# Patient Record
Sex: Female | Born: 1975 | Race: Black or African American | Hispanic: No | Marital: Single | Smoking: Never smoker
Health system: Southern US, Community
[De-identification: ages and names within clinical notes are randomized; demographics above are authoritative.]

## PROBLEM LIST (undated history)

## (undated) DIAGNOSIS — I10 Essential (primary) hypertension: Secondary | ICD-10-CM

## (undated) DIAGNOSIS — G40909 Epilepsy, unspecified, not intractable, without status epilepticus: Secondary | ICD-10-CM

## (undated) DIAGNOSIS — E785 Hyperlipidemia, unspecified: Secondary | ICD-10-CM

## (undated) HISTORY — DX: Epilepsy, unspecified, not intractable, without status epilepticus: G40.909

## (undated) HISTORY — DX: Essential (primary) hypertension: I10

## (undated) HISTORY — PX: OTHER SURGICAL HISTORY: SHX169

## (undated) HISTORY — DX: Hyperlipidemia, unspecified: E78.5

---

## 2010-12-22 HISTORY — PX: WISDOM TOOTH EXTRACTION: SHX21

## 2018-12-08 ENCOUNTER — Emergency Department: Payer: BC Managed Care – PPO

## 2018-12-08 ENCOUNTER — Observation Stay
Admission: EM | Admit: 2018-12-08 | Discharge: 2018-12-09 | Disposition: A | Discharge status: Home / Self Care | Payer: BC Managed Care – PPO | Attending: Internal Medicine | Admitting: Internal Medicine

## 2018-12-08 ENCOUNTER — Encounter: Payer: Self-pay | Admitting: Radiology

## 2018-12-08 ENCOUNTER — Other Ambulatory Visit: Payer: Self-pay

## 2018-12-08 DIAGNOSIS — G4089 Other seizures: Principal | ICD-10-CM | POA: Insufficient documentation

## 2018-12-08 DIAGNOSIS — Z79899 Other long term (current) drug therapy: Secondary | ICD-10-CM | POA: Insufficient documentation

## 2018-12-08 DIAGNOSIS — E860 Dehydration: Secondary | ICD-10-CM | POA: Diagnosis not present

## 2018-12-08 DIAGNOSIS — R569 Unspecified convulsions: Secondary | ICD-10-CM

## 2018-12-08 DIAGNOSIS — E876 Hypokalemia: Secondary | ICD-10-CM | POA: Diagnosis not present

## 2018-12-08 LAB — URINALYSIS, COMPLETE (UACMP) WITH MICROSCOPIC
Bacteria, UA: NONE SEEN
Bilirubin Urine: NEGATIVE
Glucose, UA: >=500 mg/dL — AB
KETONES UR: NEGATIVE mg/dL
Leukocytes, UA: NEGATIVE
Nitrite: NEGATIVE
PH: 5 (ref 5.0–8.0)
Protein, ur: 30 mg/dL — AB
Specific Gravity, Urine: 1.012 (ref 1.005–1.030)

## 2018-12-08 LAB — URINE DRUG SCREEN, QUALITATIVE (ARMC ONLY)
Amphetamines, Ur Screen: NOT DETECTED
Barbiturates, Ur Screen: NOT DETECTED
Benzodiazepine, Ur Scrn: NOT DETECTED
Cannabinoid 50 Ng, Ur ~~LOC~~: POSITIVE — AB
Cocaine Metabolite,Ur ~~LOC~~: NOT DETECTED
MDMA (Ecstasy)Ur Screen: NOT DETECTED
Methadone Scn, Ur: NOT DETECTED
Opiate, Ur Screen: NOT DETECTED
Phencyclidine (PCP) Ur S: NOT DETECTED
Tricyclic, Ur Screen: NOT DETECTED

## 2018-12-08 LAB — COMPREHENSIVE METABOLIC PANEL
ALT: 14 U/L (ref 0–44)
AST: 31 U/L (ref 15–41)
Albumin: 4.1 g/dL (ref 3.5–5.0)
Alkaline Phosphatase: 53 U/L (ref 38–126)
Anion gap: 15 (ref 5–15)
BUN: 8 mg/dL (ref 6–20)
CO2: 17 mmol/L — ABNORMAL LOW (ref 22–32)
Calcium: 8.9 mg/dL (ref 8.9–10.3)
Chloride: 105 mmol/L (ref 98–111)
Creatinine, Ser: 1 mg/dL (ref 0.44–1.00)
GFR calc Af Amer: >60 mL/min (ref >60)
GFR calc non Af Amer: >60 mL/min (ref >60)
Glucose, Bld: 154 mg/dL — ABNORMAL HIGH (ref 70–99)
Potassium: 3.2 mmol/L — ABNORMAL LOW (ref 3.5–5.1)
Sodium: 137 mmol/L (ref 135–145)
Total Bilirubin: 0.6 mg/dL (ref 0.3–1.2)
Total Protein: 8.1 g/dL (ref 6.5–8.1)

## 2018-12-08 LAB — CBC WITH DIFFERENTIAL/PLATELET
Abs Immature Granulocytes: 0.03 10*3/uL (ref 0.00–0.07)
Basophils Absolute: 0.1 10*3/uL (ref 0.0–0.1)
Basophils Relative: 1 %
Eosinophils Absolute: 0.1 10*3/uL (ref 0.0–0.5)
Eosinophils Relative: 1 %
HCT: 42.4 % (ref 36.0–46.0)
Hemoglobin: 13.8 g/dL (ref 12.0–15.0)
Immature Granulocytes: 0 %
LYMPHS PCT: 50 %
Lymphs Abs: 4.9 10*3/uL — ABNORMAL HIGH (ref 0.7–4.0)
MCH: 30.2 pg (ref 26.0–34.0)
MCHC: 32.5 g/dL (ref 30.0–36.0)
MCV: 92.8 fL (ref 80.0–100.0)
MONO ABS: 0.6 10*3/uL (ref 0.1–1.0)
Monocytes Relative: 6 %
Neutro Abs: 4 10*3/uL (ref 1.7–7.7)
Neutrophils Relative %: 42 %
Platelets: 537 10*3/uL — ABNORMAL HIGH (ref 150–400)
RBC: 4.57 MIL/uL (ref 3.87–5.11)
RDW: 12.6 % (ref 11.5–15.5)
WBC: 9.7 10*3/uL (ref 4.0–10.5)
nRBC: 0 % (ref 0.0–0.2)

## 2018-12-08 LAB — ETHANOL: Alcohol, Ethyl (B): <10 mg/dL (ref <10)

## 2018-12-08 LAB — TROPONIN I: Troponin I: <0.03 ng/mL (ref <0.03)

## 2018-12-08 LAB — SALICYLATE LEVEL: Salicylate Lvl: <7 mg/dL (ref 2.8–30.0)

## 2018-12-08 LAB — ACETAMINOPHEN LEVEL: Acetaminophen (Tylenol), Serum: <10 ug/mL — ABNORMAL LOW (ref 10–30)

## 2018-12-08 MED ORDER — LORAZEPAM 2 MG/ML IJ SOLN: 2.0000 mg | INTRAMUSCULAR | Status: DC | PRN

## 2018-12-08 MED ORDER — ENOXAPARIN SODIUM 40 MG/0.4ML ~~LOC~~ SOLN
40.0000 mg | SUBCUTANEOUS | Status: DC
  Administered 2018-12-08: 20:00:00 40 mg via SUBCUTANEOUS
  Filled 2018-12-08: qty 0.4

## 2018-12-08 MED ORDER — ONDANSETRON HCL 4 MG/2ML IJ SOLN
4.0000 mg | Freq: Four times a day (QID) | INTRAMUSCULAR | Status: DC | PRN
  Administered 2018-12-08: 4 mg via INTRAVENOUS
  Filled 2018-12-08: qty 2

## 2018-12-08 MED ORDER — LORAZEPAM 2 MG/ML IJ SOLN
INTRAMUSCULAR | Status: AC
  Administered 2018-12-08: 2 mg via INTRAVENOUS
  Filled 2018-12-08: qty 1

## 2018-12-08 MED ORDER — POTASSIUM CHLORIDE CRYS ER 20 MEQ PO TBCR
40.0000 meq | EXTENDED_RELEASE_TABLET | Freq: Once | ORAL | Status: AC
  Administered 2018-12-08: 40 meq via ORAL
  Filled 2018-12-08: qty 2

## 2018-12-08 MED ORDER — ONDANSETRON HCL 4 MG PO TABS: 4.0000 mg | ORAL_TABLET | Freq: Four times a day (QID) | ORAL | Status: DC | PRN

## 2018-12-08 MED ORDER — LORAZEPAM 2 MG/ML IJ SOLN
2.0000 mg | Freq: Once | INTRAMUSCULAR | Status: AC
  Administered 2018-12-08: 2 mg via INTRAVENOUS

## 2018-12-08 MED ORDER — SODIUM CHLORIDE 0.9 % IV SOLN
INTRAVENOUS | Status: DC
  Administered 2018-12-08 – 2018-12-09 (×2): via INTRAVENOUS

## 2018-12-08 MED ORDER — ACETAMINOPHEN 650 MG RE SUPP: 650.0000 mg | Freq: Four times a day (QID) | RECTAL | Status: DC | PRN

## 2018-12-08 MED ORDER — SENNOSIDES-DOCUSATE SODIUM 8.6-50 MG PO TABS: 1.0000 | ORAL_TABLET | Freq: Every evening | ORAL | Status: DC | PRN

## 2018-12-08 MED ORDER — LEVETIRACETAM IN NACL 1000 MG/100ML IV SOLN
1000.0000 mg | Freq: Once | INTRAVENOUS | Status: AC
  Administered 2018-12-08: 1000 mg via INTRAVENOUS
  Filled 2018-12-08: qty 100

## 2018-12-08 MED ORDER — ACETAMINOPHEN 325 MG PO TABS
650.0000 mg | ORAL_TABLET | Freq: Four times a day (QID) | ORAL | Status: DC | PRN
  Administered 2018-12-08 – 2018-12-09 (×2): 650 mg via ORAL
  Filled 2018-12-08 (×2): qty 2

## 2018-12-08 NOTE — ED Notes (Signed)
Patient transported to CT 

## 2018-12-08 NOTE — ED Provider Notes (Signed)
[facility] Emergency Department Provider Note ____________________________________________   First MD Initiated Contact with Patient 12/08/18 1309     (approximate)  I have reviewed the triage vital signs and the nursing notes.   HISTORY  Chief Complaint Altered Mental Status  Level 5 caveat: History of present illness limited due to altered mental status  HPI Anita Miller is a 42 y.o. female with no known PMH who presents with altered mental status.  Per EMS, the patient was at work and her coworkers heard a thump and found her on the ground unresponsive.  No seizure activity was noted at that time.  On EMS arrival the patient was alert but not responsive and then became somewhat agitated.  The patient is unable to give any significant history.  She is alert and answering some of my questions but does not remember what happened and does not know where she is.  She denies pain.  No past medical history on file.  There are no active problems to display for this patient.     Prior to Admission medications   Not on File    Allergies Patient has no allergy information on record.  No family history on file.  Social History Social History   Tobacco Use  . Smoking status: Not on file  Substance Use Topics  . Alcohol use: Not on file  . Drug use: Not on file    Review of Systems Level 5 caveat: Unable to obtain review of systems due to altered mental status   ____________________________________________   PHYSICAL EXAM:  VITAL SIGNS: ED Triage Vitals  Enc Vitals Group     BP 12/08/18 1256 (!) 143/95     Pulse Rate 12/08/18 1256 72     Resp 12/08/18 1256 18     Temp 12/08/18 1256 97.7 F (36.5 C)     Temp src --      SpO2 12/08/18 1256 94 %     Weight 12/08/18 1257 120 lb (54.4 kg)     Height 12/08/18 1257 5' 4"  (1.626 m)     Head Circumference --      Peak Flow --      Pain Score 12/08/18 1257 0     Pain Loc --      Pain  Edu? --      Excl. in [place]? --     Constitutional: Alert, disoriented.  Comfortable appearing. Eyes: Conjunctivae are normal.  EOMI.  PERRLA. Head: Atraumatic. Nose: No congestion/rhinnorhea. Mouth/Throat: Mucous membranes are moist.   Neck: Normal range of motion.  Cardiovascular: Normal rate, regular rhythm. Grossly normal heart sounds.  Good peripheral circulation. Respiratory: Normal respiratory effort.  No retractions. Lungs CTAB. Gastrointestinal: Soft and nontender. No distention.  Genitourinary: No flank tenderness. Musculoskeletal:  Extremities warm and well perfused.  Neurologic:  Normal speech.  Motor intact in all extremities. Skin:  Skin is warm and dry. No rash noted. Psychiatric: Unable to assess due to altered mental status.  ____________________________________________   LABS (all labs ordered are listed, but only abnormal results are displayed)  Labs Reviewed  COMPREHENSIVE METABOLIC PANEL - Abnormal; Notable for the following components:      Result Value   Potassium 3.2 (*)    CO2 17 (*)    Glucose, Bld 154 (*)    All other components within normal limits  CBC WITH DIFFERENTIAL/PLATELET - Abnormal; Notable for the following components:   Platelets 537 (*)    Lymphs Abs 4.9 (*)  All other components within normal limits  ACETAMINOPHEN LEVEL - Abnormal; Notable for the following components:   Acetaminophen (Tylenol), Serum <10 (*)    All other components within normal limits  TROPONIN I  ETHANOL  SALICYLATE LEVEL  URINALYSIS, COMPLETE (UACMP) WITH MICROSCOPIC  URINE DRUG SCREEN, QUALITATIVE ([facility] ONLY)   ____________________________________________  EKG  ED ECG REPORT I, Arta Silence, the attending physician, personally viewed and interpreted this ECG.  Date: 12/08/2018 EKG Time: 1300 Rate: 76 Rhythm: normal sinus rhythm QRS Axis: normal Intervals: normal ST/T Wave abnormalities: normal Narrative Interpretation: no evidence of acute  ischemia  ____________________________________________  RADIOLOGY  CT head: No acute findings  ____________________________________________   PROCEDURES  Procedure(s) performed: No  Procedures  Critical Care performed: Yes  CRITICAL CARE Performed by: Arta Silence   Total critical care time: 30 minutes  Critical care time was exclusive of separately billable procedures and treating other patients.  Critical care was necessary to treat or prevent imminent or life-threatening deterioration.  Critical care was time spent personally by me on the following activities: development of treatment plan with patient and/or surrogate as well as nursing, discussions with consultants, evaluation of patient's response to treatment, examination of patient, obtaining history from patient or surrogate, ordering and performing treatments and interventions, ordering and review of laboratory studies, ordering and review of radiographic studies, pulse oximetry and re-evaluation of patient's condition.  ____________________________________________   INITIAL IMPRESSION / ASSESSMENT AND PLAN / ED COURSE  Pertinent labs & imaging results that were available during my care of the patient were reviewed by me and considered in my medical decision making (see chart for details).  42 year old female with no known PMH presents with altered mental status after she was found unresponsive at work.  Her mental status has slightly improved during the time between EMS arrival and arrival in the ED, however she remains confused.  On exam, she is alert but disoriented.  She is able to tell me her name but she believes she is at the "[place]" and does not know the year.  She denies any pain.  Neuro exam is nonfocal and there is no evidence of trauma.  I reviewed the past medical records in Epic; the patient has no previous visits here and her last outside visit that I am able to see is from 2016 at [facility] for unrelated symptoms.  Overall presentation is most consistent with seizure and postictal state although the patient has no known seizure history.  We will obtain CT head, lab work-up, and reassess.  ----------------------------------------- 1:45 PM on 12/08/2018 -----------------------------------------  The patient had a generalized tonic-clonic seizure witnessed by me.  IV Ativan was given although the seizure had resolved spontaneously.  I will also load the patient with Keppra.  She is awaiting CT head.  Given that she had a second seizure without returning to baseline mental status, she will likely require admission.  ----------------------------------------- 3:05 PM on 12/08/2018 -----------------------------------------  Patient has been given Ativan and loaded with IV Keppra.  CT head shows no concerning acute findings and the lab work-up so far is unremarkable.  The patient is starting to awaken.  I signed the patient out to the hospitalist Dr. Estanislado Pandy.   ____________________________________________   FINAL CLINICAL IMPRESSION(S) / ED DIAGNOSES  Final diagnoses:  Seizure ([place])      NEW MEDICATIONS STARTED DURING THIS VISIT:  New Prescriptions   No medications on file     Note:  This document was prepared  using Systems analyst and may include unintentional dictation errors.    Arta Silence, MD 12/08/18 1505

## 2018-12-08 NOTE — ED Notes (Signed)
Pt vomitted after taking oral potassium

## 2018-12-08 NOTE — ED Notes (Signed)
Pt remains confused at this time.  Answers to name and some yes/no questions.  Pt denies hx of seizures at this time.  Unable to complete screening questions due to mental status, will monitor.

## 2018-12-08 NOTE — Progress Notes (Signed)
Advanced care plan.  Purpose of the Encounter: CODE STATUS  Parties in Attendance: Patient  Patient's Decision Capacity: Good  Subjective/Patient's story: Presented to the emergency room for seizure   Objective/Medical story Patient had 2 seizures Needs work-up for etiology Needs seizure medication and neurology evaluation  Goals of care determination:  Advance care directives goals of care treatment plan discussed Patient wants everything done for now which includes CPR, intubation ventilator if the need arises   CODE STATUS: Full code   Time spent discussing advanced care planning: 16 minutes

## 2018-12-08 NOTE — H&P (Signed)
[place] [facility] [place] NAME: Anita Miller    MR#:  [id number]  DATE OF BIRTH:  Jul 04, 1976  DATE OF ADMISSION:  12/08/2018  PRIMARY CARE PHYSICIAN: Patient, No Pcp Per   REQUESTING/REFERRING PHYSICIAN:   CHIEF COMPLAINT:   Chief Complaint  Patient presents with  . Altered Mental Status    HISTORY OF PRESENT ILLNESS: Anita Miller  is a 42 y.o. female with no significant past medical history presented to the emergency room for confusion.  Patient's coworkers at work heard a thump and she was found on the floor.  Seizure activity was noted in the emergency room by ER physician.  At the workplace we do not know exactly whether anyone witnessed the seizure.  But patient had a seizure in the emergency room which was tonic-clonic generalized convulsions.  Patient was given Ativan.  Patient says she did not have any history of seizures.  No history of any head injury.  PAST MEDICAL HISTORY:  None  PAST SURGICAL HISTORY: None  SOCIAL HISTORY:  Social History   Tobacco Use  . Smoking status: Not on file  Substance Use Topics  . Alcohol use: Not on file    FAMILY HISTORY: No history of diabetes, seizure disorder, cancer in the family  DRUG ALLERGIES: No known drug allergies  REVIEW OF SYSTEMS:   CONSTITUTIONAL: No fever, fatigue or weakness.  EYES: No blurred or double vision.  EARS, NOSE, AND THROAT: No tinnitus or ear pain.  RESPIRATORY: No cough, shortness of breath, wheezing or hemoptysis.  CARDIOVASCULAR: No chest pain, orthopnea, edema.  GASTROINTESTINAL: No nausea, vomiting, diarrhea or abdominal pain.  GENITOURINARY: No dysuria, hematuria.  ENDOCRINE: No polyuria, nocturia,  HEMATOLOGY: No anemia, easy bruising or bleeding SKIN: No rash or lesion. MUSCULOSKELETAL: No joint pain or arthritis.   NEUROLOGIC: No tingling, numbness, weakness.  PSYCHIATRY: No anxiety or depression.   MEDICATIONS AT HOME:  Prior to  Admission medications   Not on File      PHYSICAL EXAMINATION:   VITAL SIGNS: Blood pressure (!) 164/97, pulse 72, temperature 97.7 F (36.5 C), resp. rate (!) 24, height 5' 4"  (1.626 m), weight 54.4 kg, SpO2 94 %.  GENERAL:  42 y.o.-year-old patient lying in the bed with no acute distress.  EYES: Pupils equal, round, reactive to light and accommodation. No scleral icterus. Extraocular muscles intact.  HEENT: Head atraumatic, normocephalic. Oropharynx and nasopharynx clear.  NECK:  Supple, no jugular venous distention. No thyroid enlargement, no tenderness.  LUNGS: Normal breath sounds bilaterally, no wheezing, rales,rhonchi or crepitation. No use of accessory muscles of respiration.  CARDIOVASCULAR: S1, S2 normal. No murmurs, rubs, or gallops.  ABDOMEN: Soft, nontender, nondistended. Bowel sounds present. No organomegaly or mass.  EXTREMITIES: No pedal edema, cyanosis, or clubbing.  NEUROLOGIC: Cranial nerves II through XII are intact. Muscle strength 5/5 in all extremities. Sensation intact. Gait not checked.  PSYCHIATRIC: The patient is alert and oriented x 3.  SKIN: No obvious rash, lesion, or ulcer.   LABORATORY PANEL:   CBC Recent Labs  Lab 12/08/18 1259  WBC 9.7  HGB 13.8  HCT 42.4  PLT 537*  MCV 92.8  MCH 30.2  MCHC 32.5  RDW 12.6  LYMPHSABS 4.9*  MONOABS 0.6  EOSABS 0.1  BASOSABS 0.1   ------------------------------------------------------------------------------------------------------------------  Chemistries  Recent Labs  Lab 12/08/18 1259  NA 137  K 3.2*  CL 105  CO2 17*  GLUCOSE 154*  BUN 8  CREATININE 1.00  CALCIUM 8.9  AST 31  ALT 14  ALKPHOS 53  BILITOT 0.6   ------------------------------------------------------------------------------------------------------------------ estimated creatinine clearance is 63.6 mL/min (by C-G formula based on SCr of 1  mg/dL). ------------------------------------------------------------------------------------------------------------------ No results for input(s): TSH, T4TOTAL, T3FREE, THYROIDAB in the last 72 hours.  Invalid input(s): FREET3   Coagulation profile No results for input(s): INR, PROTIME in the last 168 hours. ------------------------------------------------------------------------------------------------------------------- No results for input(s): DDIMER in the last 72 hours. -------------------------------------------------------------------------------------------------------------------  Cardiac Enzymes Recent Labs  Lab 12/08/18 1259  TROPONINI <0.03   ------------------------------------------------------------------------------------------------------------------ Invalid input(s): POCBNP  ---------------------------------------------------------------------------------------------------------------  Urinalysis No results found for: COLORURINE, APPEARANCEUR, LABSPEC, PHURINE, GLUCOSEU, HGBUR, BILIRUBINUR, KETONESUR, PROTEINUR, UROBILINOGEN, NITRITE, LEUKOCYTESUR   RADIOLOGY: Ct Head Wo Contrast  Result Date: 12/08/2018 CLINICAL DATA:  Altered level of consciousness. Patient found alert but unresponsive on arrival. EXAM: CT HEAD WITHOUT CONTRAST TECHNIQUE: Contiguous axial images were obtained from the base of the skull through the vertex without intravenous contrast. COMPARISON:  None. FINDINGS: Brain: No evidence of acute infarction, hemorrhage, hydrocephalus, extra-axial collection or mass lesion/mass effect. Vascular: No hyperdense vessel or unexpected calcification. Skull: Normal. Negative for fracture or focal lesion. Sinuses/Orbits: No acute finding. Other: None. IMPRESSION: Normal head CT Electronically Signed   By: Ashley Royalty M.D.   On: 12/08/2018 14:04    EKG: Orders placed or performed during the hospital encounter of 12/08/18  . EKG 12-Lead  . EKG 12-Lead     IMPRESSION AND PLAN:  42 year old female patient with no significant past medical history presented to the emergency room for seizure  -Seizure Admit patient to medical floor observation bed IV Keppra 1 g given in the emergency room Neurology consultation Message sent via epic haiku EEG  -Acute hypokalemia Replace potassium orally  -DVT prophylaxis subcu Lovenox daily  -Dehydration Hydration with IV fluids  All the records are reviewed and case discussed with ED provider. Management plans discussed with the patient, family and they are in agreement.  CODE STATUS:Full code    TOTAL TIME TAKING CARE OF THIS PATIENT: 52 minutes.    Saundra Shelling M.D on 12/08/2018 at 3:30 PM  Between 7am to 6pm - Pager - [phone]  After 6pm go to [email or web address] - password [email or web address] [facility]  Office  [phone]  CC: Primary care physician; Patient, No Pcp Per

## 2018-12-08 NOTE — ED Triage Notes (Signed)
PT to ER via EMS from work.  Co-workers heard a "boom" and went into office to find pt on floor unresponsive.  EMS reports pt was alert but unresponsive on their arrival.  Reports that pt was pulling at BP cuff, IV and straps on ride in.  Pt was oriented to person only.

## 2018-12-09 ENCOUNTER — Observation Stay: Payer: BC Managed Care – PPO

## 2018-12-09 DIAGNOSIS — R569 Unspecified convulsions: Secondary | ICD-10-CM

## 2018-12-09 LAB — BASIC METABOLIC PANEL
Anion gap: 8 (ref 5–15)
BUN: 7 mg/dL (ref 6–20)
CALCIUM: 8.2 mg/dL — AB (ref 8.9–10.3)
CO2: 20 mmol/L — ABNORMAL LOW (ref 22–32)
Chloride: 109 mmol/L (ref 98–111)
Creatinine, Ser: 1.02 mg/dL — ABNORMAL HIGH (ref 0.44–1.00)
GFR calc Af Amer: >60 mL/min (ref >60)
Glucose, Bld: 122 mg/dL — ABNORMAL HIGH (ref 70–99)
Potassium: 3.5 mmol/L (ref 3.5–5.1)
Sodium: 137 mmol/L (ref 135–145)

## 2018-12-09 LAB — CBC
HCT: 36.1 % (ref 36.0–46.0)
Hemoglobin: 12.1 g/dL (ref 12.0–15.0)
MCH: 30.1 pg (ref 26.0–34.0)
MCHC: 33.5 g/dL (ref 30.0–36.0)
MCV: 89.8 fL (ref 80.0–100.0)
Platelets: 379 10*3/uL (ref 150–400)
RBC: 4.02 MIL/uL (ref 3.87–5.11)
RDW: 12.5 % (ref 11.5–15.5)
WBC: 12.3 10*3/uL — ABNORMAL HIGH (ref 4.0–10.5)
nRBC: 0 % (ref 0.0–0.2)

## 2018-12-09 MED ORDER — MAGNESIUM SULFATE 2 GM/50ML IV SOLN
2.0000 g | Freq: Once | INTRAVENOUS | Status: AC
  Administered 2018-12-09: 12:00:00 2 g via INTRAVENOUS
  Filled 2018-12-09: qty 50

## 2018-12-09 MED ORDER — GADOBUTROL 1 MMOL/ML IV SOLN
5.5000 mL | Freq: Once | INTRAVENOUS | Status: AC | PRN
  Administered 2018-12-09: 5.5 mL via INTRAVENOUS

## 2018-12-09 NOTE — Progress Notes (Signed)
   12/09/18 0755  Clinical Encounter Type  Visited With Patient and family together  Visit Type Initial;Spiritual support  Referral From Nurse  Consult/Referral To Chaplain  Spiritual Encounters  Spiritual Needs Prayer;Other (Comment)   [place] received an OR to educate the patient on the AD process. When I encounter the patient, she did not remember asking for the AD. I left the paperwork with her to complete if she so desired.

## 2018-12-09 NOTE — Progress Notes (Signed)
Pt for discharge. No resp distress. eeg completed this pm. Dr sudini discharging pt.  No further seizure activity noted.  Instructions to pt.  No new meds/ diet / activity  And f/u  Given verbalizes understanding.

## 2018-12-09 NOTE — Procedures (Signed)
History: 42 yo F being evaluated for seizure-like activity  Sedation: None  Technique: This is a 21 channel routine scalp EEG performed at the bedside with bipolar and monopolar montages arranged in accordance to the international 10/20 system of electrode placement. One channel was dedicated to EKG recording.    Background: The background consists of intermixed alpha and beta activities. There is a well defined posterior dominant rhythm of 9 Hz that attenuates with eye opening. Sleep is recorded with normal appearing structures.   Photic stimulation: Physiologic driving is present  EEG Abnormalities: None  Clinical Interpretation: This normal EEG is recorded in the waking and sleep state. There was no seizure or seizure predisposition recorded on this study. Please note that lack of epileptiform activity on EEG does not preclude the possibility of epilepsy.   Roland Rack, MD [facility] [phone]  If 7pm- 7am, please page neurology on call as listed in [place].

## 2018-12-09 NOTE — Consult Note (Signed)
Reason for Consult: Seizure Referring Physician: Hillary Bow, MD  CC: Seizure like activity.  HPI: Anita Miller is an 42 y.o. female with no significant past medical history presenting to the ED on 12/08/2018 with altered mental status.  Per ED reports, patient was at work when her coworkers had a left thumb and found her unresponsive on the floor. Patient states that she was standing at a vending machine and the next thing she recalled was being in the ED. Denies associated symptoms preceding the syncope of aura, nausea and vomiting, feeling cold or clammy, visual auras or blurry vision, palpitations shortness of breath chest pain. There was no loss of bladder control or tongue biting. Denies associated altered sensorium, speech abnormality, cranial nerve deficit, focal motor or sensory deficits, diplopia, nausea or vomiting, ipsilateral or contralateral paralysis/weakness, numbness or tingling, involuntary movements, tremor. He denies history of head injury or trauma or recent infection. On arrival to the ED patient was alert but not responsive initially and became somewhat agitated.  On initial evaluation she was alert and oriented to self only, she was disoriented to place believing she was at the disco and could not tell the year.  There was no focal neurologic deficit noted on exam.  Patient apparently had a witnessed generalized tonic-clonic seizure in the ED.  IV Ativan was given with resolution of seizure activity.  She was also loaded with IV Keppra following the Ativan administration.  Initial CT head was negative with no acute intracranial abnormality noted.  Patient was therefore admitted for further work-up and management.  History reviewed. No pertinent past medical history.  History reviewed. No pertinent surgical history.  History reviewed. No pertinent family history.  Social History:  reports that she has never smoked. She has never used smokeless tobacco. She reports current  drug use. Frequency: 2.00 times per week. Drug: Marijuana. No history on file for alcohol.  Not on File  Medications:  I have reviewed the patient's current medications. Prior to Admission:  Medications Prior to Admission  Medication Sig Dispense Refill Last Dose  . norelgestromin-ethinyl estradiol (ORTHO EVRA) 150-35 MCG/24HR transdermal patch Place 1 patch onto the skin once a week.   Past Week at Unknown time   Scheduled: . enoxaparin (LOVENOX) injection  40 mg Subcutaneous Q24H    ROS: History obtained from the patient   General ROS: negative for - chills, fatigue, fever, night sweats, weight gain or weight loss Psychological ROS: negative for - behavioral disorder, hallucinations, memory difficulties, mood swings or suicidal ideation Ophthalmic ROS: negative for - blurry vision, double vision, eye pain or loss of vision ENT ROS: negative for - epistaxis, nasal discharge, oral lesions, sore throat, tinnitus or vertigo Allergy and Immunology ROS: negative for - hives or itchy/watery eyes Hematological and Lymphatic ROS: negative for - bleeding problems, bruising or swollen lymph nodes Endocrine ROS: negative for - galactorrhea, hair pattern changes, polydipsia/polyuria or temperature intolerance Respiratory ROS: negative for - cough, hemoptysis, shortness of breath or wheezing Cardiovascular ROS: negative for - chest pain, dyspnea on exertion, edema or irregular heartbeat Gastrointestinal ROS: negative for - abdominal pain, diarrhea, hematemesis, nausea/vomiting or stool incontinence Genito-Urinary ROS: negative for - dysuria, hematuria, incontinence or urinary frequency/urgency Musculoskeletal ROS: negative for - joint swelling or muscular weakness Neurological ROS: as noted in HPI Dermatological ROS: negative for rash and skin lesion changes  Physical Examination: Blood pressure 115/73, pulse 77, temperature 98.1 F (36.7 C), temperature source Oral, resp. rate 20, height 5'  4" (1.626  m), weight 54.4 kg, last menstrual period 11/30/2018, SpO2 99 %.  HEENT-  Normocephalic, no lesions, without obvious abnormality.  Normal external eye and conjunctiva.  Normal TM's bilaterally.  Normal auditory canals and external ears. Normal external nose, mucus membranes and septum.  Normal pharynx. Cardiovascular- S1, S2 normal, pulses palpable throughout   Lungs- chest clear, no wheezing, rales, normal symmetric air entry Abdomen- soft, non-tender; bowel sounds normal; no masses,  no organomegaly Extremities- no edema Lymph-no adenopathy palpable Musculoskeletal-no joint tenderness, deformity or swelling Skin-warm and dry, no hyperpigmentation, vitiligo, or suspicious lesions  Neurological Exam   Mental Status: Alert, oriented, thought content appropriate.  Speech fluent without evidence of aphasia.  Able to follow 3 step commands without difficulty. Attention span and concentration seemed appropriate  Cranial Nerves: II: Discs flat bilaterally; Visual fields grossly normal, pupils equal, round, reactive to light and accommodation III,IV, VI: ptosis not present, extra-ocular motions intact bilaterally V,VII: smile symmetric, facial light touch sensation intact VIII: hearing normal bilaterally IX,X: gag reflex present XI: bilateral shoulder shrug XII: midline tongue extension Motor: Right :  Upper extremity   5/5 Without pronator drift      Left: Upper extremity   5/5 without pronator drift Right:   Lower extremity   5/5                                          Left: Lower extremity   5/5 Tone and bulk:normal tone throughout; no atrophy noted Sensory: Pinprick and light touch intact bilaterally Deep Tendon Reflexes: 2+ and symmetric throughout Plantars: Right: mute                              Left: mute Cerebellar: Finger-to-nose testing intact bilaterally. Heel to shin testing normal bilaterally Gait: not tested due to safety concerns  Data Reviewed  Laboratory  Studies:   Basic Metabolic Panel: Recent Labs  Lab 12/08/18 1259 12/09/18 0450  NA 137 137  K 3.2* 3.5  CL 105 109  CO2 17* 20*  GLUCOSE 154* 122*  BUN 8 7  CREATININE 1.00 1.02*  CALCIUM 8.9 8.2*    Liver Function Tests: Recent Labs  Lab 12/08/18 1259  AST 31  ALT 14  ALKPHOS 53  BILITOT 0.6  PROT 8.1  ALBUMIN 4.1   No results for input(s): LIPASE, AMYLASE in the last 168 hours. No results for input(s): AMMONIA in the last 168 hours.  CBC: Recent Labs  Lab 12/08/18 1259 12/09/18 0450  WBC 9.7 12.3*  NEUTROABS 4.0  --   HGB 13.8 12.1  HCT 42.4 36.1  MCV 92.8 89.8  PLT 537* 379    Cardiac Enzymes: Recent Labs  Lab 12/08/18 1259  TROPONINI <0.03    BNP: Invalid input(s): POCBNP  CBG: No results for input(s): GLUCAP in the last 168 hours.  Microbiology: No results found for this or any previous visit.  Coagulation Studies: No results for input(s): LABPROT, INR in the last 72 hours.  Urinalysis:  Recent Labs  Lab 12/08/18 1619  COLORURINE STRAW*  LABSPEC 1.012  PHURINE 5.0  GLUCOSEU >=500*  HGBUR MODERATE*  BILIRUBINUR NEGATIVE  KETONESUR NEGATIVE  PROTEINUR 30*  NITRITE NEGATIVE  LEUKOCYTESUR NEGATIVE    Lipid Panel:  No results found for: CHOL, TRIG, HDL, CHOLHDL, VLDL, LDLCALC  HgbA1C: No results found for: HGBA1C  Urine Drug Screen:      Component Value Date/Time   LABOPIA NONE DETECTED 12/08/2018 1619   COCAINSCRNUR NONE DETECTED 12/08/2018 1619   LABBENZ NONE DETECTED 12/08/2018 1619   AMPHETMU NONE DETECTED 12/08/2018 1619   THCU POSITIVE (A) 12/08/2018 1619   LABBARB NONE DETECTED 12/08/2018 1619    Alcohol Level:  Recent Labs  Lab 12/08/18 [place] <10    Other results: EKG: normal EKG, normal sinus rhythm, unchanged from previous tracings.  Imaging: Ct Head Wo Contrast  Result Date: 12/08/2018 CLINICAL DATA:  Altered level of consciousness. Patient found alert but unresponsive on arrival. EXAM: CT HEAD  WITHOUT CONTRAST TECHNIQUE: Contiguous axial images were obtained from the base of the skull through the vertex without intravenous contrast. COMPARISON:  None. FINDINGS: Brain: No evidence of acute infarction, hemorrhage, hydrocephalus, extra-axial collection or mass lesion/mass effect. Vascular: No hyperdense vessel or unexpected calcification. Skull: Normal. Negative for fracture or focal lesion. Sinuses/Orbits: No acute finding. Other: None. IMPRESSION: Normal head CT Electronically Signed   By: Ashley Royalty M.D.   On: 12/08/2018 14:04   Mr Jeri Cos OI Contrast  Result Date: 12/09/2018 CLINICAL DATA:  New onset seizure EXAM: MRI HEAD WITHOUT AND WITH CONTRAST TECHNIQUE: Multiplanar, multiecho pulse sequences of the brain and surrounding structures were obtained without and with intravenous contrast. CONTRAST:  5.5 mL Gadavist COMPARISON:  Head CT 12/08/2018 FINDINGS: BRAIN: There is no acute infarct, acute hemorrhage, hydrocephalus or extra-axial collection. The midline structures are normal. No midline shift or other mass effect. There are no old infarcts. The white matter signal is normal for the patient's age. The cerebral and cerebellar volume are age-appropriate. Susceptibility-sensitive sequences show no chronic microhemorrhage or superficial siderosis. The hippocampi are normal and symmetric in size and signal. The hypothalamus and mamillary bodies are normal. There is no cortical ectopia or dysplasia. VASCULAR: Major intracranial arterial and venous sinus flow voids are normal. SKULL AND UPPER CERVICAL SPINE: Calvarial bone marrow signal is normal. There is no skull base mass. Visualized upper cervical spine and soft tissues are normal. SINUSES/ORBITS: No fluid levels or advanced mucosal thickening. No mastoid or middle ear effusion. The orbits are normal. IMPRESSION: Normal MRI of the brain. Electronically Signed   By: Ulyses Jarred M.D.   On: 12/09/2018 04:14   Assessment: 42 y.o female with no  significant past medical history presenting to the ED on 12/08/2018 with altered mental status concerns for possible seizure. Had a second witnessed generalized tonic-clonic seizure activity in the ED. IV Ativan was given with resolution of seizure activity.  She was also loaded with IV Keppra following the Ativan administration.  Initial CT head was negative with no acute intracranial abnormality noted.  Follow-up MRI of the brain reviewed and shows no acute intracranial abnormality.  Labs revealed ethanol levels of less than 10 mg/dL, negative troponin, elevated platelets of 537, potassium 3.2, blood glucose 154, UDS positive for cannabinoid. Repeat CBC shows elevated white count of 12.3, creatinine of 1.03.  Plan: 1. EEG pending 2. Seizure precautions 3. Ativan prn seizure activity 4. Will hold of starting AED at this time given likelihood of single provoked episode 5. Patient unable to drive, operate heavy machinery, perform activities at heights and participate in water activities until release by outpatient physician.  This patient was staffed with Dr. Irish Elders, A[facility] Frame who personally evaluated patient, reviewed documentation and agreed with assessment and plan of care as above.  Rufina Falco, DNP, FNP-BC Board certified Nurse Practitioner Neurology  Department   12/09/2018, 1:21 PM

## 2018-12-09 NOTE — Progress Notes (Signed)
eeg completed ° °

## 2018-12-09 NOTE — Plan of Care (Signed)
  Problem: Spiritual Needs Goal: Ability to function at adequate level Outcome: Progressing   Problem: Education: Goal: Knowledge of General Education information will improve Description Including pain rating scale, medication(s)/side effects and non-pharmacologic comfort measures Outcome: Progressing   Problem: Clinical Measurements: Goal: Ability to maintain clinical measurements within normal limits will improve Outcome: Progressing   Problem: Safety: Goal: Ability to remain free from injury will improve Outcome: Progressing   Problem: Education: Goal: Expressions of having a comfortable level of knowledge regarding the disease process will increase Outcome: Progressing   Problem: Clinical Measurements: Goal: Complications related to the disease process, condition or treatment will be avoided or minimized Outcome: Progressing

## 2018-12-09 NOTE — Discharge Instructions (Signed)
Do not drive, operate heavy machinery, perform activities at heights and participate in water activities until released by outpatient physician.

## 2018-12-10 LAB — HIV ANTIBODY (ROUTINE TESTING W REFLEX): HIV Screen 4th Generation wRfx: NONREACTIVE

## 2018-12-22 NOTE — Discharge Summary (Signed)
[place] [facility] [place] NAME: Anita Miller    MR#:  [id number]  DATE OF BIRTH:  24-Jan-1976  DATE OF ADMISSION:  12/08/2018 ADMITTING PHYSICIAN: Saundra Shelling, MD  DATE OF DISCHARGE: 12/09/2018  7:04 PM  PRIMARY CARE PHYSICIAN: Patient, No Pcp Per   ADMISSION DIAGNOSIS:  Seizure ([place]) [R56.9]  DISCHARGE DIAGNOSIS:  Active Problems:   Seizure ([place])  SECONDARY DIAGNOSIS:  History reviewed. No pertinent past medical history.  ADMITTING HISTORY  HISTORY OF PRESENT ILLNESS: Anita Miller  is a 43 y.o. female with no significant past medical history presented to the emergency room for confusion.  Patient's coworkers at work heard a thump and she was found on the floor.  Seizure activity was noted in the emergency room by ER physician.  At the workplace we do not know exactly whether anyone witnessed the seizure.  But patient had a seizure in the emergency room which was tonic-clonic generalized convulsions.  Patient was given Ativan.  Patient says she did not have any history of seizures.  No history of any head injury.  HOSPITAL COURSE:   *Seizures.  Patient was admitted to medical floor.  Initially loaded with Keppra in the emergency room.  Ativan as needed used.  Seen by neurology.  MRI of the brain showed nothing acute.  EEG showed no seizure activity.  Due to this being first episode neurology did not recommend continuing any antiseizure medications.  Follow-up with outpatient neurologist.  Discharged home in stable condition  CONSULTS OBTAINED:  Treatment Team:  Catarina Hartshorn, MD  DRUG ALLERGIES:  Not on File  DISCHARGE MEDICATIONS:   Allergies as of 12/09/2018   Not on File     Medication List    TAKE these medications   norelgestromin-ethinyl estradiol 150-35 MCG/24HR transdermal patch Commonly known as:  ORTHO EVRA Place 1 patch onto the skin once a week.       Today   VITAL SIGNS:  Blood pressure 115/73,  pulse 77, temperature 98.1 F (36.7 C), temperature source Oral, resp. rate 20, height 5' 4"  (1.626 m), weight 54.4 kg, last menstrual period 11/30/2018, SpO2 99 %.  I/O:  No intake or output data in the 24 hours ending 12/22/18 1411  PHYSICAL EXAMINATION:  Physical Exam  GENERAL:  43 y.o.-year-old patient lying in the bed with no acute distress.  LUNGS: Normal breath sounds bilaterally, no wheezing, rales,rhonchi or crepitation. No use of accessory muscles of respiration.  CARDIOVASCULAR: S1, S2 normal. No murmurs, rubs, or gallops.  ABDOMEN: Soft, non-tender, non-distended. Bowel sounds present. No organomegaly or mass.  NEUROLOGIC: Moves all 4 extremities. PSYCHIATRIC: The patient is alert and oriented x 3.  SKIN: No obvious rash, lesion, or ulcer.   DATA REVIEW:   CBC No results for input(s): WBC, HGB, HCT, PLT in the last 168 hours.  Chemistries  No results for input(s): NA, K, CL, CO2, GLUCOSE, BUN, CREATININE, CALCIUM, MG, AST, ALT, ALKPHOS, BILITOT in the last 168 hours.  Invalid input(s): GFRCGP  Cardiac Enzymes No results for input(s): TROPONINI in the last 168 hours.  Microbiology Results  No results found for this or any previous visit.  RADIOLOGY:  No results found.  Follow up with PCP in 1 week.  Management plans discussed with the patient, family and they are in agreement.  CODE STATUS:  Code Status History    Date Active Date Inactive Code Status Order ID Comments User Context   12/08/2018 1932 12/10/2018 0030 Full Code  [id number]  Saundra Shelling, MD Inpatient      TOTAL TIME TAKING CARE OF THIS PATIENT ON DAY OF DISCHARGE: more than 30 minutes.   Anita Miller M.D on 12/22/2018 at 2:11 PM  Between 7am to 6pm - Pager - [phone]  After 6pm go to [email or web address] - password [email or web address] [facility]  Office  [phone]  CC: Primary care physician; Patient, No Pcp Per  Note: This dictation was prepared with Dragon dictation  along with smaller phrase technology. Any transcriptional errors that result from this process are unintentional.

## 2019-01-06 NOTE — Care Management (Signed)
Post discharge note entry 01/06/19: RNCM notified of patient need for return to work/work note for dates in hospital.  RNCM spoke with patient by phone [phone] to confirm need. She states the "1C staff is working with doctors and was told that work note was ready for pick up however she has FMLA paperwork here too.  She hopes that it can be faxed to the fax number on the forms".  She said she can pick these items up in the AM 01/07/19.  RNCM spoke with unit clerk on 1c and this is being addressed with [facility] providers; she will follow up with Megan with [employer] to check patient status. Patient denies RNCM need. Patient has my contact information for questions.

## 2020-05-08 ENCOUNTER — Other Ambulatory Visit: Payer: Self-pay

## 2020-05-08 ENCOUNTER — Encounter: Payer: Self-pay | Admitting: Obstetrics and Gynecology

## 2020-05-08 ENCOUNTER — Ambulatory Visit (INDEPENDENT_AMBULATORY_CARE_PROVIDER_SITE_OTHER): Payer: BC Managed Care – PPO | Admitting: Obstetrics and Gynecology

## 2020-05-08 VITALS — BP 124/86 | HR 92 | Ht 64.0 in | Wt 142.0 lb

## 2020-05-08 DIAGNOSIS — Z131 Encounter for screening for diabetes mellitus: Secondary | ICD-10-CM | POA: Diagnosis not present

## 2020-05-08 DIAGNOSIS — Z113 Encounter for screening for infections with a predominantly sexual mode of transmission: Secondary | ICD-10-CM

## 2020-05-08 DIAGNOSIS — R030 Elevated blood-pressure reading, without diagnosis of hypertension: Secondary | ICD-10-CM | POA: Diagnosis not present

## 2020-05-08 DIAGNOSIS — Z1231 Encounter for screening mammogram for malignant neoplasm of breast: Secondary | ICD-10-CM | POA: Diagnosis not present

## 2020-05-08 DIAGNOSIS — Z13220 Encounter for screening for lipoid disorders: Secondary | ICD-10-CM

## 2020-05-08 DIAGNOSIS — Z01419 Encounter for gynecological examination (general) (routine) without abnormal findings: Secondary | ICD-10-CM

## 2020-05-08 NOTE — Progress Notes (Signed)
GYNECOLOGY ANNUAL PHYSICAL EXAM PROGRESS NOTE  Subjective:    Anita Miller is a 44 y.o. [id number] female who presents for an annual exam. She is transferring care from [facility]. The patient has no complaints today. The patient is not currently sexually active. The patient wears seatbelts: yes. The patient participates in regular exercise: yes (just started regular exercise routine 1 month ago and changing diet). Has the patient ever been transfused or tattooed?: yes (professional tattoos). The patient reports that there is not domestic violence in her life.   Of note, patient states that she has a history of a "bacterial infection" last year.  Attempted to take several different forms of antibiotic prescribed (Azithromycin), however was not unable to tolerate any of them.  Wonders if she still needs to be treated.   Menstrual History: Menarche age: 12 Patient's last menstrual period was 04/16/2020. Period Duration (Days): 3 Period Pattern: Regular Menstrual Flow: Moderate Menstrual Control: Maxi pad Dysmenorrhea: (!) Mild Dysmenorrhea Symptoms: Cramping  Gynecologic History: Contraception: Xulane patch History of STI's: Denies Last Pap: 2020 (performed at [place] [place]). Results were: normal.  Reports remote h/o abnormal pap smears  X 1. Last mammogram: 2019. Results were: normal.    OB History  Gravida Para Term Preterm AB Living  2 2 2  0 0 2  SAB TAB Ectopic Multiple Live Births  0 0 0 0 2    # Outcome Date GA Lbr Len/2nd Weight Sex Delivery Anes PTL Lv  2 Term 02/14/06    M Vag-Spont   LIV  1 Term 09/30/00    F Vag-Spont   LIV    Past Medical History:  Diagnosis Date  . Epilepsy [facility])     Past Surgical History:  Procedure Laterality Date  . no surgical history      Family History  Problem Relation Age of Onset  . Healthy Mother   . Healthy Father     Social History   Socioeconomic History  . Marital status: Single    Spouse name: Not  on file  . Number of children: Not on file  . Years of education: Not on file  . Highest education level: Not on file  Occupational History  . Not on file  Tobacco Use  . Smoking status: Never Smoker  . Smokeless tobacco: Never Used  Substance and Sexual Activity  . Alcohol use: Yes    Comment: socially   . Drug use: Not Currently    Frequency: 2.0 times per week    Types: Marijuana  . Sexual activity: Not Currently    Birth control/protection: Patch  Other Topics Concern  . 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 Difficulty of Paying Living Expenses:   Food Insecurity:   . Worried About Trayce fundraiser in the Last Year:   . Arboriculturist in the Last Year:   Transportation Needs:   . Film/video editor (Medical):   Marland Kitchen Lack of Transportation (Non-Medical):   Physical Activity:   . Days of Exercise per Week:   . Minutes of Exercise per Session:   Stress:   . Feeling of Stress :   Social Connections:   . Frequency of Communication with Friends and Family:   . Frequency of Social Gatherings with Friends and Family:   . Attends Religious Services:   . Active Member  of Clubs or Organizations:   . Attends Archivist Meetings:   Marland Kitchen Marital Status:   Intimate Partner Violence:   . Fear of Current or Ex-Partner:   . Emotionally Abused:   Marland Kitchen Physically Abused:   . Sexually Abused:     Current Outpatient Medications on File Prior to Visit  Medication Sig Dispense Refill  . lamoTRIgine (LAMICTAL) 25 MG tablet Take 25 mg by mouth daily. Take 2 tablets in the morning and 2 tablets in the evening.    . norelgestromin-ethinyl estradiol (ORTHO EVRA) 150-35 MCG/24HR transdermal patch Place 1 patch onto the skin once a week.     No current facility-administered medications on file prior to visit.    No Known Allergies    Review of Systems Constitutional:  negative for chills, fatigue, fevers and sweats Eyes: negative for irritation, redness and visual disturbance Ears, nose, mouth, throat, and face: negative for hearing loss, nasal congestion, snoring and tinnitus Respiratory: negative for asthma, cough, sputum Cardiovascular: negative for chest pain, dyspnea, exertional chest pressure/discomfort, irregular heart beat, palpitations and syncope Gastrointestinal: negative for abdominal pain, change in bowel habits, nausea and vomiting Genitourinary: negative for abnormal menstrual periods, genital lesions, sexual problems and vaginal discharge, dysuria and urinary incontinence Integument/breast: negative for breast lump, breast tenderness and nipple discharge Hematologic/lymphatic: negative for bleeding and easy bruising Musculoskeletal:negative for back pain and muscle weakness Neurological: negative for dizziness, headaches, vertigo and weakness Endocrine: negative for diabetic symptoms including polydipsia, polyuria and skin dryness Allergic/Immunologic: negative for hay fever and urticaria        Objective:  Blood pressure (!) 147/97, pulse 92, height 5' 4"  (1.626 m), weight 142 lb (64.4 kg), last menstrual period 04/16/2020. Body mass index is 24.37 kg/m.  Repeat BP 124/86.   General Appearance:    Alert, cooperative, no distress, appears stated age  Head:    Normocephalic, without obvious abnormality, atraumatic  Eyes:    PERRL, conjunctiva/corneas clear, EOM's intact, both eyes  Ears:    Normal external ear canals, both ears  Nose:   Nares normal, septum midline, mucosa normal, no drainage or sinus tenderness  Throat:   Lips, mucosa, and tongue normal; teeth and gums normal  Neck:   Supple, symmetrical, trachea midline, no adenopathy; thyroid: no enlargement/tenderness/nodules; no carotid bruit or JVD  Back:     Symmetric, no curvature, ROM normal, no CVA tenderness  Lungs:     Clear to auscultation bilaterally, respirations unlabored   Chest Wall:    No tenderness or deformity   Heart:    Regular rate and rhythm, S1 and S2 normal, no murmur, rub or gallop  Breast Exam:    No tenderness, masses, or nipple abnormality  Abdomen:     Soft, non-tender, bowel sounds active all four quadrants, no masses, no organomegaly.    Genitalia:    Pelvic:external genitalia normal, vagina without lesions, discharge, or tenderness, rectovaginal septum  normal. Cervix normal in appearance, no cervical motion tenderness, no adnexal masses or tenderness.  Uterus normal size, shape, mobile, regular contours, nontender.  Rectal:    Normal external sphincter.  No hemorrhoids appreciated. Internal exam not done.   Extremities:   Extremities normal, atraumatic, no cyanosis or edema  Pulses:   2+ and symmetric all extremities  Skin:   Skin color, texture, turgor normal, no rashes or lesions  Lymph nodes:   Cervical, supraclavicular, and axillary nodes normal  Neurologic:   CNII-XII intact, normal strength, sensation and reflexes throughout   .  Labs:  Lab Results  Component Value Date   WBC 12.3 (H) 12/09/2018   HGB 12.1 12/09/2018   HCT 36.1 12/09/2018   MCV 89.8 12/09/2018   PLT 379 12/09/2018    Lab Results  Component Value Date   CREATININE 1.02 (H) 12/09/2018   BUN 7 12/09/2018   NA 137 12/09/2018   K 3.5 12/09/2018   CL 109 12/09/2018   CO2 20 (L) 12/09/2018    Lab Results  Component Value Date   ALT 14 12/08/2018   AST 31 12/08/2018   ALKPHOS 53 12/08/2018   BILITOT 0.6 12/08/2018    No results found for: TSH   Assessment:   1. Encounter for well woman exam with routine gynecological exam   2. Breast cancer screening by mammogram   3. Elevated blood pressure reading without diagnosis of hypertension   4. Screening for diabetes mellitus   5. Screening for lipid disorders   6. Screen for STD (sexually transmitted disease)     Plan:     Blood tests: CBC with diff, Comprehensive metabolic panel, Lipoproteins,  random glucose, Vitamin D, and TSH. Breast self exam technique reviewed and patient encouraged to perform self-exam monthly. Contraception: Xulane patches weekly. Discussed healthy lifestyle modifications. Mammogram ordered. Pap smear performed today.   Patient desires STD screening, including serology. Will order.  Elevated BP today, no prior history of HTN.  Repeat BP improved. Will continue to monitor at subsequent visits.  Follow up in 1 year for annual exam.     Rubie Maid, MD [facility]

## 2020-05-08 NOTE — Patient Instructions (Addendum)
Health Maintenance, Female Adopting a healthy lifestyle and getting preventive care are important in promoting health and wellness. Ask your health care provider about:  The right schedule for you to have regular tests and exams.  Things you can do on your own to prevent diseases and keep yourself healthy. What should I know about diet, weight, and exercise? Eat a healthy diet   Eat a diet that includes plenty of vegetables, fruits, low-fat dairy products, and lean protein.  Do not eat a lot of foods that are high in solid fats, added sugars, or sodium. Maintain a healthy weight Body mass index (BMI) is used to identify weight problems. It estimates body fat based on height and weight. Your health care provider can help determine your BMI and help you achieve or maintain a healthy weight. Get regular exercise Get regular exercise. This is one of the most important things you can do for your health. Most adults should:  Exercise for at least 150 minutes each week. The exercise should increase your heart rate and make you sweat (moderate-intensity exercise).  Do strengthening exercises at least twice a week. This is in addition to the moderate-intensity exercise.  Spend less time sitting. Even light physical activity can be beneficial. Watch cholesterol and blood lipids Have your blood tested for lipids and cholesterol at 43 years of age, then have this test every 5 years. Have your cholesterol levels checked more often if:  Your lipid or cholesterol levels are high.  You are older than 43 years of age.  You are at high risk for heart disease. What should I know about cancer screening? Depending on your health history and family history, you may need to have cancer screening at various ages. This may include screening for:  Breast cancer.  Cervical cancer.  Colorectal cancer.  Skin cancer.  Lung cancer. What should I know about heart disease, diabetes, and high blood  pressure? Blood pressure and heart disease  High blood pressure causes heart disease and increases the risk of stroke. This is more likely to develop in people who have high blood pressure readings, are of African descent, or are overweight.  Have your blood pressure checked: ? Every 3-5 years if you are 18-39 years of age. ? Every year if you are 40 years old or older. Diabetes Have regular diabetes screenings. This checks your fasting blood sugar level. Have the screening done:  Once every three years after age 40 if you are at a normal weight and have a low risk for diabetes.  More often and at a younger age if you are overweight or have a high risk for diabetes. What should I know about preventing infection? Hepatitis B If you have a higher risk for hepatitis B, you should be screened for this virus. Talk with your health care provider to find out if you are at risk for hepatitis B infection. Hepatitis C Testing is recommended for:  Everyone born from 1945 through 1965.  Anyone with known risk factors for hepatitis C. Sexually transmitted infections (STIs)  Get screened for STIs, including gonorrhea and chlamydia, if: ? You are sexually active and are younger than 43 years of age. ? You are older than 43 years of age and your health care provider tells you that you are at risk for this type of infection. ? Your sexual activity has changed since you were last screened, and you are at increased risk for chlamydia or gonorrhea. Ask your health care provider if   you are at risk.  Ask your health care provider about whether you are at high risk for HIV. Your health care provider may recommend a prescription medicine to help prevent HIV infection. If you choose to take medicine to prevent HIV, you should first get tested for HIV. You should then be tested every 3 months for as long as you are taking the medicine. Pregnancy  If you are about to stop having your period (premenopausal) and  you may become pregnant, seek counseling before you get pregnant.  Take 400 to 800 micrograms (mcg) of folic acid every day if you become pregnant.  Ask for birth control (contraception) if you want to prevent pregnancy. Osteoporosis and menopause Osteoporosis is a disease in which the bones lose minerals and strength with aging. This can result in bone fractures. If you are 56 years old or older, or if you are at risk for osteoporosis and fractures, ask your health care provider if you should:  Be screened for bone loss.  Take a calcium or vitamin D supplement to lower your risk of fractures.  Be given hormone replacement therapy (HRT) to treat symptoms of menopause. Follow these instructions at home: Lifestyle  Do not use any products that contain nicotine or tobacco, such as cigarettes, e-cigarettes, and chewing tobacco. If you need help quitting, ask your health care provider.  Do not use street drugs.  Do not share needles.  Ask your health care provider for help if you need support or information about quitting drugs. Alcohol use  Do not drink alcohol if: ? Your health care provider tells you not to drink. ? You are pregnant, may be pregnant, or are planning to become pregnant.  If you drink alcohol: ? Limit how much you use to 0-1 drink a day. ? Limit intake if you are breastfeeding.  Be aware of how much alcohol is in your drink. In the [place] one drink equals one 12 oz bottle of beer (355 mL), one 5 oz glass of wine (148 mL), or one 1 oz glass of hard liquor (44 mL). General instructions  Schedule regular health, dental, and eye exams.  Stay current with your vaccines.  Tell your health care provider if: ? You often feel depressed. ? You have ever been abused or do not feel safe at home. Summary  Adopting a healthy lifestyle and getting preventive care are important in promoting health and wellness.  Follow your health care provider's instructions about healthy  diet, exercising, and getting tested or screened for diseases.  Follow your health care provider's instructions on monitoring your cholesterol and blood pressure. This information is not intended to replace advice given to you by your health care provider. Make sure you discuss any questions you have with your health care provider. Document Revised: 12/01/2018 Document Reviewed: 12/01/2018 Elsevier Patient Education  2020 [place] Breast self-awareness means being familiar with how your breasts look and feel. It involves checking your breasts regularly and reporting any changes to your health care provider. Practicing breast self-awareness is important. Sometimes changes may not be harmful (are benign), but sometimes a change in your breasts can be a sign of a serious medical problem. It is important to learn how to do this procedure correctly so that you can catch problems early, when treatment is more likely to be successful. All women should practice breast self-awareness, including women who have had breast implants. What you need: A mirror. A well-lit room.  How to do a breast self-exam A breast self-exam is one way to learn what is normal for your breasts and whether your breasts are changing. To do a breast self-exam: Look for changes  Remove all the clothing above your waist. Stand in front of a mirror in a room with good lighting. Put your hands on your hips. Push your hands firmly downward. Compare your breasts in the mirror. Look for differences between them (asymmetry), such as: Differences in shape. Differences in size. Puckers, dips, and bumps in one breast and not the other. Look at each breast for changes in the skin, such as: Redness. Scaly areas. Look for changes in your nipples, such as: Discharge. Bleeding. Dimpling. Redness. A change in position. Feel for changes Carefully feel your breasts for lumps and changes. It is best to  do this while lying on your back on the floor, and again while sitting or standing in the tub or shower with soapy water on your skin. Feel each breast in the following way: Place the arm on the side of the breast you are examining above your head. Feel your breast with the other hand. Start in the nipple area and make -inch (2 cm) overlapping circles to feel your breast. Use the pads of your three middle fingers to do this. Apply light pressure, then medium pressure, then firm pressure. The light pressure will allow you to feel the tissue closest to the skin. The medium pressure will allow you to feel the tissue that is a little deeper. The firm pressure will allow you to feel the tissue close to the ribs. Continue the overlapping circles, moving downward over the breast until you feel your ribs below your breast. Move one finger-width toward the center of the body. Continue to use the -inch (2 cm) overlapping circles to feel your breast as you move slowly up toward your collarbone. Continue the up-and-down exam using all three pressures until you reach your armpit.  Write down what you find Writing down what you find can help you remember what to discuss with your health care provider. Write down: What is normal for each breast. Any changes that you find in each breast, including: The kind of changes you find. Any pain or tenderness. Size and location of any lumps. Where you are in your menstrual cycle, if you are still menstruating. General tips and recommendations Examine your breasts every month. If you are breastfeeding, the best time to examine your breasts is after a feeding or after using a breast pump. If you menstruate, the best time to examine your breasts is 5-7 days after your period. Breasts are generally lumpier during menstrual periods, and it may be more difficult to notice changes. With time and practice, you will become more familiar with the variations in your breasts and  more comfortable with the exam. Contact a health care provider if you: See a change in the shape or size of your breasts or nipples. See a change in the skin of your breast or nipples, such as a reddened or scaly area. Have unusual discharge from your nipples. Find a lump or thick area that was not there before. Have pain in your breasts. Have any concerns related to your breast health. Summary Breast self-awareness includes looking for physical changes in your breasts, as well as feeling for any changes within your breasts. Breast self-awareness should be performed in front of a mirror in a well-lit room. You should examine your breasts every month. If  you menstruate, the best time to examine your breasts is 5-7 days after your menstrual period. Let your health care provider know of any changes you notice in your breasts, including changes in size, changes on the skin, pain or tenderness, or unusual fluid from your nipples. This information is not intended to replace advice given to you by your health care provider. Make sure you discuss any questions you have with your health care provider. Document Revised: 07/27/2018 Document Reviewed: 07/27/2018 Elsevier Patient Education  [employer].

## 2020-05-08 NOTE — Progress Notes (Signed)
New Pt to est care. Pt stated that she was doing well. No issues at this time.

## 2020-05-09 ENCOUNTER — Telehealth: Payer: Self-pay | Admitting: Obstetrics and Gynecology

## 2020-05-09 LAB — LIPID PANEL
Chol/HDL Ratio: 3.5 ratio (ref 0.0–4.4)
Cholesterol, Total: 201 mg/dL — ABNORMAL HIGH (ref 100–199)
HDL: 57 mg/dL (ref >39)
LDL Chol Calc (NIH): 121 mg/dL — ABNORMAL HIGH (ref 0–99)
Triglycerides: 132 mg/dL (ref 0–149)
VLDL Cholesterol Cal: 23 mg/dL (ref 5–40)

## 2020-05-09 LAB — CBC
Hematocrit: 40.6 % (ref 34.0–46.6)
Hemoglobin: 13.9 g/dL (ref 11.1–15.9)
MCH: 30.8 pg (ref 26.6–33.0)
MCHC: 34.2 g/dL (ref 31.5–35.7)
MCV: 90 fL (ref 79–97)
Platelets: 386 10*3/uL (ref 150–450)
RBC: 4.51 x10E6/uL (ref 3.77–5.28)
RDW: 12.6 % (ref 11.7–15.4)
WBC: 6.1 10*3/uL (ref 3.4–10.8)

## 2020-05-09 LAB — COMPREHENSIVE METABOLIC PANEL
ALT: 27 IU/L (ref 0–32)
AST: 21 IU/L (ref 0–40)
Albumin/Globulin Ratio: 1.6 (ref 1.2–2.2)
Albumin: 4.3 g/dL (ref 3.8–4.8)
Alkaline Phosphatase: 102 IU/L (ref 48–121)
BUN/Creatinine Ratio: 7 — ABNORMAL LOW (ref 9–23)
BUN: 7 mg/dL (ref 6–24)
Bilirubin Total: 0.3 mg/dL (ref 0.0–1.2)
CO2: 24 mmol/L (ref 20–29)
Calcium: 9.5 mg/dL (ref 8.7–10.2)
Chloride: 102 mmol/L (ref 96–106)
Creatinine, Ser: 0.95 mg/dL (ref 0.57–1.00)
GFR calc Af Amer: 85 mL/min/{1.73_m2} (ref >59)
GFR calc non Af Amer: 74 mL/min/{1.73_m2} (ref >59)
Globulin, Total: 2.7 g/dL (ref 1.5–4.5)
Glucose: 75 mg/dL (ref 65–99)
Potassium: 4 mmol/L (ref 3.5–5.2)
Sodium: 135 mmol/L (ref 134–144)
Total Protein: 7 g/dL (ref 6.0–8.5)

## 2020-05-09 LAB — TSH: TSH: 2.3 u[IU]/mL (ref 0.450–4.500)

## 2020-05-09 LAB — HIV ANTIBODY (ROUTINE TESTING W REFLEX): HIV Screen 4th Generation wRfx: NONREACTIVE

## 2020-05-09 LAB — HEPATITIS C ANTIBODY: Hep C Virus Ab: <0.1 s/co ratio (ref 0.0–0.9)

## 2020-05-09 LAB — RPR: RPR Ser Ql: NONREACTIVE

## 2020-05-09 NOTE — Telephone Encounter (Signed)
[facility] called saying they don't have any orders for some labs for this patient. Could you please advise.

## 2020-05-10 ENCOUNTER — Other Ambulatory Visit (HOSPITAL_COMMUNITY)
Admission: RE | Admit: 2020-05-10 | Discharge: 2020-05-10 | Disposition: A | Discharge status: Home / Self Care | Payer: BC Managed Care – PPO | Source: Ambulatory Visit | Attending: Obstetrics and Gynecology | Admitting: Obstetrics and Gynecology

## 2020-05-10 DIAGNOSIS — Z113 Encounter for screening for infections with a predominantly sexual mode of transmission: Secondary | ICD-10-CM | POA: Insufficient documentation

## 2020-05-10 NOTE — Telephone Encounter (Signed)
Send in order to lab for pt.

## 2020-05-10 NOTE — Addendum Note (Signed)
Addended by: Edwyna Shell on: 05/10/2020 10:19 AM   Modules accepted: Orders

## 2020-05-11 ENCOUNTER — Telehealth: Payer: Self-pay | Admitting: Obstetrics and Gynecology

## 2020-05-11 LAB — CERVICOVAGINAL ANCILLARY ONLY
Bacterial Vaginitis (gardnerella): NEGATIVE
Candida Glabrata: NEGATIVE
Candida Vaginitis: NEGATIVE
Chlamydia: NEGATIVE
Comment: NEGATIVE
Comment: NEGATIVE
Comment: NEGATIVE
Comment: NEGATIVE
Comment: NEGATIVE
Comment: NORMAL
Neisseria Gonorrhea: NEGATIVE
Trichomonas: NEGATIVE

## 2020-05-11 NOTE — Telephone Encounter (Signed)
Patient called in saying she missed a phone call from someone here. Unsure of who it was as there was no telephone encounter in her chart. Informed patient I would document she called returning a missed phone call.

## 2020-05-25 ENCOUNTER — Other Ambulatory Visit: Payer: Self-pay

## 2020-05-25 MED ORDER — NORELGESTROMIN-ETH ESTRADIOL 150-35 MCG/24HR TD PTWK: 1.0000 | MEDICATED_PATCH | TRANSDERMAL | 12 refills | Status: DC

## 2020-08-01 HISTORY — PX: COSMETIC SURGERY: SHX468

## 2020-08-28 ENCOUNTER — Ambulatory Visit
Admission: EM | Admit: 2020-08-28 | Discharge: 2020-08-28 | Disposition: A | Discharge status: Home / Self Care | Payer: BC Managed Care – PPO | Attending: Family Medicine | Admitting: Family Medicine

## 2020-08-28 ENCOUNTER — Other Ambulatory Visit: Payer: Self-pay

## 2020-08-28 DIAGNOSIS — Z0189 Encounter for other specified special examinations: Secondary | ICD-10-CM | POA: Diagnosis not present

## 2020-08-28 DIAGNOSIS — Z20822 Contact with and (suspected) exposure to covid-19: Secondary | ICD-10-CM | POA: Diagnosis not present

## 2020-08-28 DIAGNOSIS — Z1152 Encounter for screening for COVID-19: Secondary | ICD-10-CM

## 2020-08-28 NOTE — ED Triage Notes (Signed)
Patient in today for a covid test for work. Patient denies exposure or symptoms.

## 2020-08-28 NOTE — Discharge Instructions (Signed)
Please continue to keep social distance of 6 feet from others, wash hands frequently and wear facemask indoors or outdoors if you are unable to social distance.  If your Covid test is positive, member of the urgent care team will reach out to you with further instructions. If you have any further questions, please don't hesitate to reach out to the urgent care clinic.  Please sign up for MyChart to access your lab results.

## 2020-08-29 LAB — SARS CORONAVIRUS 2 (TAT 6-24 HRS): SARS Coronavirus 2: NEGATIVE

## 2020-10-04 ENCOUNTER — Telehealth: Payer: Self-pay

## 2020-10-04 NOTE — Telephone Encounter (Signed)
Please ask patient the name of her other patches are there are several different brands, and you can send those in for her for the year.

## 2020-10-04 NOTE — Telephone Encounter (Signed)
Pt called in and stated that she BC patches aren't staying on. The pt says that she takes a shower and they fall off.  The pt is requesting something else. She said that she loved her old patches, that the ones she uses now are messing up her cycle she is bleeding through. I told the pt I will send a message to the nurse and to please allow 24 to 48 hours for a reply. The [t verbally understood. Please advise

## 2020-10-04 NOTE — Telephone Encounter (Signed)
Good evening Dr. Marcelline Mates,  Please advise. Thanks [employer]

## 2020-10-09 ENCOUNTER — Other Ambulatory Visit: Payer: Self-pay

## 2020-10-09 MED ORDER — XULANE 150-35 MCG/24HR TD PTWK: 1.0000 | MEDICATED_PATCH | TRANSDERMAL | 12 refills | Status: DC

## 2020-10-09 NOTE — Telephone Encounter (Signed)
Patient called in stating that she was previously using the Xulane patches.

## 2020-10-09 NOTE — Telephone Encounter (Signed)
Pt called no answer no VM set up. Sent pt a mychart message yesterday waiting on reply.

## 2021-05-09 ENCOUNTER — Encounter: Payer: BC Managed Care – PPO | Admitting: Obstetrics and Gynecology

## 2021-05-09 DIAGNOSIS — Z01419 Encounter for gynecological examination (general) (routine) without abnormal findings: Secondary | ICD-10-CM

## 2021-05-09 DIAGNOSIS — Z1231 Encounter for screening mammogram for malignant neoplasm of breast: Secondary | ICD-10-CM

## 2021-05-10 ENCOUNTER — Encounter: Payer: Self-pay | Admitting: Obstetrics and Gynecology

## 2021-07-30 ENCOUNTER — Ambulatory Visit: Payer: Self-pay | Admitting: *Deleted

## 2021-07-30 ENCOUNTER — Encounter: Payer: Self-pay | Admitting: Physician Assistant

## 2021-07-30 ENCOUNTER — Telehealth: Payer: Self-pay | Admitting: Physician Assistant

## 2021-07-30 DIAGNOSIS — J069 Acute upper respiratory infection, unspecified: Secondary | ICD-10-CM

## 2021-07-30 MED ORDER — AZITHROMYCIN 250 MG PO TABS: 250.0000 mg | ORAL_TABLET | Freq: Every day | ORAL | 0 refills | Status: DC

## 2021-07-30 NOTE — Telephone Encounter (Signed)
Summary: Sore throat   Patient experiencing a sore throat for 1 week not improving, tea is not working, seeking clinical advice. Scheduled NPA with Dr. Zigmund Daniel on 08/01/2021     Patient has appointment- tele health- today 3:45- will check in with patient after appointment.

## 2021-07-30 NOTE — Progress Notes (Signed)
[name], Anita Miller are scheduled for a virtual visit with your provider today.    Just as we do with appointments in the office, we must obtain your consent to participate.  Your consent will be active for this visit and any virtual visit you may have with one of our providers in the next 365 days.    If you have a MyChart account, I can also send a copy of this consent to you electronically.  All virtual visits are billed to your insurance company just like a traditional visit in the office.  As this is a virtual visit, video technology does not allow for your provider to perform a traditional examination.  This may limit your provider's ability to fully assess your condition.  If your provider identifies any concerns that need to be evaluated in person or the need to arrange testing such as labs, EKG, etc, we will make arrangements to do so.    Although advances in technology are sophisticated, we cannot ensure that it will always work on either your end or our end.  If the connection with a video visit is poor, we may have to switch to a telephone visit.  With either a video or telephone visit, we are not always able to ensure that we have a secure connection.   I need to obtain your verbal consent now.   Are you willing to proceed with your visit today?   Anita Miller has provided verbal consent on 07/30/2021 for a virtual visit (video or telephone).   Rodney Booze, PA-C 07/30/2021  4:01 PM  Anita Miller,you are scheduled for a virtual visit with your provider today.    Just as we do with appointments in the office, we must obtain your consent to participate.  Your consent will be active for this visit and any virtual visit you may have with one of our providers in the next 365 days.    If you have a MyChart account, I can also send a copy of this consent to you electronically.  All virtual visits are billed to your insurance company just like a traditional visit in the office.  As this is  a virtual visit, video technology does not allow for your provider to perform a traditional examination.  This may limit your provider's ability to fully assess your condition.  If your provider identifies any concerns that need to be evaluated in person or the need to arrange testing such as labs, EKG, etc, we will make arrangements to do so.    Although advances in technology are sophisticated, we cannot ensure that it will always work on either your end or our end.  If the connection with a video visit is poor, we may have to switch to a telephone visit.  With either a video or telephone visit, we are not always able to ensure that we have a secure connection.   I need to obtain your verbal consent now.   Are you willing to proceed with your visit today?   Anita Miller has provided verbal consent on 07/30/2021 for a virtual visit (video or telephone).   Rodney Booze, PA-C 07/30/2021  4:01 PM   Date:  07/30/2021   ID:  Anita Miller, DOB 1976-10-06, MRN [id number]  Patient Location: Home Provider Location: Home Office   Participants: Patient and Provider for Visit and Wrap up  Method of visit: Video  Location of Patient: Home Location of Provider: Home Office Consent was obtain for visit  over the video. Services rendered by provider: Visit was performed via video  A video enabled telemedicine application was used and I verified that I am speaking with the correct person using two identifiers.  PCP:  Patient, No Pcp Per (Inactive)   Chief Complaint:  sore throat  History of Present Illness:    Anita Miller is a 45 y.o. female with history as stated below. Presents video telehealth for an acute care visit  Pt c/o uri sxs. She states that last week she had congestion, cough, chills and fatigue that started several days ago. She further c/o a sore throat. States sore throat is constant. She has tried hot tea without significant relief. She has taken 4 covid tests and  they have all been negative.  Past Medical, Surgical, Social History, Allergies, and Medications have been Reviewed.  Past Medical History:  Diagnosis Date   Epilepsy ([place])     Current Meds  Medication Sig   azithromycin (ZITHROMAX Z-PAK) 250 MG tablet Take 1 tablet (250 mg total) by mouth daily. Take 2 tablets on the first day of treatment. Then take 1 tablet per day for the next four days.     Allergies:   Patient has no known allergies.   ROS See HPI for history of present illness.  Physical Exam Vitals reviewed.  HENT:     Head: Normocephalic.  Neurological:     Mental Status: She is alert.         MDM: pt c/o sore throat for several days. She is also some chills. Will cover her with abx for possible bacterial uri.    There are no diagnoses linked to this encounter.   Time:   Today, I have spent 10 minutes with the patient with telehealth technology discussing the above problems, reviewing the chart, previous notes, medications and orders.    Tests Ordered: No orders of the defined types were placed in this encounter.   Medication Changes: Meds ordered this encounter  Medications   azithromycin (ZITHROMAX Z-PAK) 250 MG tablet    Sig: Take 1 tablet (250 mg total) by mouth daily. Take 2 tablets on the first day of treatment. Then take 1 tablet per day for the next four days.    Dispense:  6 tablet    Refill:  0    Order Specific Question:   Supervising Provider    Answer:   Noemi Chapel [[id number]]     Disposition:  Follow up  Signed, [place], PA-C  07/30/2021 4:01 PM

## 2021-07-30 NOTE — Patient Instructions (Signed)
  Anita Miller, thank you for joining Rodney Booze, PA-C for today's virtual visit.  While this provider is not your primary care provider (PCP), if your PCP is located in our provider database this encounter information will be shared with them immediately following your visit.  Consent: (Patient) Anita Miller provided verbal consent for this virtual visit at the beginning of the encounter.  Current Medications:  Current Outpatient Medications:    lamoTRIgine (LAMICTAL) 25 MG tablet, Take 25 mg by mouth daily. Take 2 tablets in the morning and 2 tablets in the evening., Disp: , Rfl:    norelgestromin-ethinyl estradiol Marilu Favre) 150-35 MCG/24HR transdermal patch, Place 1 patch onto the skin once a week., Disp: 3 patch, Rfl: 12   Medications ordered in this encounter:  No orders of the defined types were placed in this encounter.    *If you need refills on other medications prior to your next appointment, please contact your pharmacy*  Follow-Up: Call back or seek an in-person evaluation if the symptoms worsen or if the condition fails to improve as anticipated.  Other Instructions Take the prescription as directed   If you have been instructed to have an in-person evaluation today at a local Urgent Care facility, please use the link below. It will take you to a list of all of our available [facility], including address, phone number and hours of operation. Please do not delay care.  [facility]  If you or a family member do not have a primary care provider, use the link below to schedule a visit and establish care. When you choose a [facility] primary care physician or advanced practice provider, you gain a long-term partner in health. Find a Primary Care Provider  Learn more about [facility]'s in-office and virtual care options: [facility]

## 2021-07-30 NOTE — Telephone Encounter (Signed)
Reason for Disposition . Caller has already spoken with the PCP and has no further questions.  Answer Assessment - Initial Assessment Questions 1. REASON FOR CALL or QUESTION: "What is your reason for calling today?" or "How can I best help you?" or "What question do you have that I can help answer?"     Patient had called with sore throat- she had virtual visit and was treated for upper respiratory infection. Spoke with patient and she will follow up in office at upcoming appointment.  Protocols used: Information Only Call - No Triage-A-AH, No Contact or Duplicate Contact Call-A-AH

## 2021-08-01 ENCOUNTER — Other Ambulatory Visit: Payer: Self-pay

## 2021-08-01 ENCOUNTER — Ambulatory Visit: Payer: BC Managed Care – PPO | Admitting: Family Medicine

## 2021-08-01 ENCOUNTER — Encounter: Payer: Self-pay | Admitting: Family Medicine

## 2021-08-01 VITALS — BP 162/102 | HR 85 | Temp 98.2°F | Ht 64.0 in | Wt 143.0 lb

## 2021-08-01 DIAGNOSIS — J029 Acute pharyngitis, unspecified: Secondary | ICD-10-CM | POA: Diagnosis not present

## 2021-08-01 LAB — POCT INFLUENZA A/B
Influenza A, POC: NEGATIVE
Influenza B, POC: NEGATIVE

## 2021-08-01 LAB — POCT RAPID STREP A (OFFICE): Rapid Strep A Screen: NEGATIVE

## 2021-08-01 MED ORDER — MAGIC MOUTHWASH W/LIDOCAINE: 5.0000 mL | Freq: Four times a day (QID) | ORAL | 1 refills | Status: DC | PRN

## 2021-08-01 MED ORDER — METHYLPREDNISOLONE 4 MG PO TBPK: ORAL_TABLET | ORAL | 0 refills | Status: DC

## 2021-08-01 NOTE — Progress Notes (Signed)
Primary Care / Sports Medicine Office Visit  Patient Information:  Patient ID: Anita Miller, female DOB: Oct 21, 1976 Age: 45 y.o. MRN: [id number]   Anita Miller is a pleasant 45 y.o. female presenting with the following:  Chief Complaint  Patient presents with   New Patient (Initial Visit)   Establish Care   Sore Throat    Since 07/23/21; feels like lump in throat, chills, dry cough, hoarseness and left ear pain associated; virtual visit with [facility] 07/30/21 and advised for patient to be seen in office; has started Z-pak x2 days, Mucinex suspension, but not effective; negative Covid home test today; 6/10 pain    Review of Systems pertinent details above   Patient Active Problem List   Diagnosis Date Noted   Pharyngitis 08/01/2021   Seizure ([place]) 12/08/2018   Past Medical History:  Diagnosis Date   Epilepsy [facility])    Outpatient Encounter Medications as of 08/01/2021  Medication Sig   azithromycin (ZITHROMAX Z-PAK) 250 MG tablet Take 1 tablet (250 mg total) by mouth daily. Take 2 tablets on the first day of treatment. Then take 1 tablet per day for the next four days.   lamoTRIgine (LAMICTAL) 100 MG tablet Take 100 mg by mouth daily.   methylPREDNISolone (MEDROL DOSEPAK) 4 MG TBPK tablet Take Dosepak per instructions for full course   norelgestromin-ethinyl estradiol Marilu Favre) 150-35 MCG/24HR transdermal patch Place 1 patch onto the skin once a week.   [DISCONTINUED] magic mouthwash w/lidocaine SOLN Take 5 mLs by mouth 4 (four) times daily as needed for mouth pain. Swish for 2 minutes then spit out.   magic mouthwash w/lidocaine SOLN Take 5 mLs by mouth 4 (four) times daily as needed for mouth pain. Swish for 2 minutes then spit out.   [DISCONTINUED] lamoTRIgine (LAMICTAL) 25 MG tablet Take 25 mg by mouth daily. Take 2 tablets in the morning and 2 tablets in the evening.   No facility-administered encounter medications on file as of 08/01/2021.   Past Surgical History:   Procedure Laterality Date   COSMETIC SURGERY  08/01/2020   WISDOM TOOTH EXTRACTION Bilateral 2012    Vitals:   08/01/21 1551  BP: (!) 162/102  Pulse: 85  Temp: 98.2 F (36.8 C)  SpO2: 99%   Vitals:   08/01/21 1551  Weight: 143 lb (64.9 kg)  Height: 5' 4"  (1.626 m)   Body mass index is 24.55 kg/m.  No results found.   Independent interpretation of notes and tests performed by another provider:   None  Procedures performed:   None  Pertinent History, Exam, Impression, and Recommendations:   Pharyngitis History of progressive throat pain from 07/23/2021, dry cough, and recent left ear pain.  Initially improved so she went out this weekend with her family to crowded environment (arcade), symptoms significantly worsened and evolved to include left ear pain, did see virtual visit provider on 07/30/2021 who prescribed azithromycin which she has dosed twice with stable symptoms.  She has been dosing Mucinex and OTC analgesics as well.  Physical exam reveals mildly hoarse voice, oropharynx with tonsillar crypts containing tonsilloliths, mild generalized erythema, bilateral nasal turbinates are significantly swollen, nontender sinuses, bilateral TMs and canals benign, there is nontender lymphadenopathy at the anterior cervical chain on the left, cardiopulmonary examination is benign.  Point-of-care negative strep and flu noted, COVID PCR swab obtained.  Concern for pharyngitis/laryngitis, most likely viral in etiology but will have patient finish out remaining azithromycin to cover for possible secondary/superimposed bacterial component.  I  have written for a course of steroids, Magic mouthwash, and advised Flonase in addition to her current OTC medications.  Dry and hacking cough is listed as a possible adverse effect from lamotrigine however given additional clinical history components, this is to be considered only if symptoms persist despite the above.   Orders & Medications Meds  ordered this encounter  Medications   methylPREDNISolone (MEDROL DOSEPAK) 4 MG TBPK tablet    Sig: Take Dosepak per instructions for full course    Dispense:  21 each    Refill:  0   DISCONTD: magic mouthwash w/lidocaine SOLN    Sig: Take 5 mLs by mouth 4 (four) times daily as needed for mouth pain. Swish for 2 minutes then spit out.    Dispense:  500 mL    Refill:  1   magic mouthwash w/lidocaine SOLN    Sig: Take 5 mLs by mouth 4 (four) times daily as needed for mouth pain. Swish for 2 minutes then spit out.    Dispense:  500 mL    Refill:  1   Orders Placed This Encounter  Procedures   Novel Coronavirus, NAA ([facility])   POCT Influenza A/B   POCT rapid strep A     Return in about 4 weeks (around 08/29/2021) for physical.     Montel Culver, MD   [facility]

## 2021-08-01 NOTE — Assessment & Plan Note (Signed)
History of progressive throat pain from 07/23/2021, dry cough, and recent left ear pain.  Initially improved so she went out this weekend with her family to crowded environment (arcade), symptoms significantly worsened and evolved to include left ear pain, did see virtual visit provider on 07/30/2021 who prescribed azithromycin which she has dosed twice with stable symptoms.  She has been dosing Mucinex and OTC analgesics as well.  Physical exam reveals mildly hoarse voice, oropharynx with tonsillar crypts containing tonsilloliths, mild generalized erythema, bilateral nasal turbinates are significantly swollen, nontender sinuses, bilateral TMs and canals benign, there is nontender lymphadenopathy at the anterior cervical chain on the left, cardiopulmonary examination is benign.  Point-of-care negative strep and flu noted, COVID PCR swab obtained.  Concern for pharyngitis/laryngitis, most likely viral in etiology but will have patient finish out remaining azithromycin to cover for possible secondary/superimposed bacterial component.  I have written for a course of steroids, Magic mouthwash, and advised Flonase in addition to her current OTC medications.  Dry and hacking cough is listed as a possible adverse effect from lamotrigine however given additional clinical history components, this is to be considered only if symptoms persist despite the above.

## 2021-08-01 NOTE — Patient Instructions (Addendum)
-   Take prednisone Dosepak for full course (oral steroids) - Finish Zpack (azithromycin) - Start Flonase (fluticasone) in each nostril daily until oral steroids complete - Continue Mucinex - Can use magic mouthwash for pain up to 4 times a day as-needed - Can use Tylenol as-needed for additional pain control - Drink plenty of fluids and get rest - Remain out of work until Monday - Contact us next week for any lingering symptoms - Return in 4 weeks for annual physical - Contact for questions between now and then

## 2021-08-02 ENCOUNTER — Telehealth: Payer: Self-pay

## 2021-08-02 ENCOUNTER — Encounter: Payer: Self-pay | Admitting: Family Medicine

## 2021-08-02 LAB — NOVEL CORONAVIRUS, NAA: SARS-CoV-2, NAA: NOT DETECTED

## 2021-08-02 LAB — SARS-COV-2, NAA 2 DAY TAT

## 2021-08-02 NOTE — Telephone Encounter (Signed)
Copied from [id number]. Topic: General - Other >> Aug 02, 2021  9:55 AM Tessa Lerner A wrote: Reason for CRM: Christian with [facility] has called about patient's magic mouthwash w/lidocaine SOLN [[id number]]  prescriptions  [facility] would like the prescription to include a list of ingredients needed in the compound as well as the ratio for mixing the ingredients   Please contact further when possible

## 2021-08-02 NOTE — Telephone Encounter (Signed)
Please advise for medrol dose pack instructions.

## 2021-08-02 NOTE — Telephone Encounter (Signed)
Spoke with Christian at [facility] and verified mouthwash components.  Verbalized understanding.  No further questions.

## 2021-08-22 ENCOUNTER — Telehealth: Payer: Self-pay | Admitting: Obstetrics and Gynecology

## 2021-08-22 NOTE — Telephone Encounter (Signed)
Anita Miller called in and scheduled her annual and stated she is suffering from a UTI.  Patient states she isn't in a lot of pain but when she urinates she has an uncomfortable feeling and has been feeling like this since Monday of this week.  Patient would like to know if she should be concerned or if something can be called in.  Patient verified [facility] in [place].  Please advise.

## 2021-08-23 NOTE — Telephone Encounter (Signed)
Pt called no answer. Sent mychart message

## 2021-08-28 NOTE — Telephone Encounter (Signed)
Please see mychart messages. Anita Miller

## 2021-08-29 ENCOUNTER — Ambulatory Visit: Payer: BC Managed Care – PPO | Admitting: Family Medicine

## 2021-08-30 ENCOUNTER — Encounter: Payer: Self-pay | Admitting: Family Medicine

## 2021-08-30 ENCOUNTER — Other Ambulatory Visit: Payer: Self-pay

## 2021-08-30 ENCOUNTER — Ambulatory Visit (INDEPENDENT_AMBULATORY_CARE_PROVIDER_SITE_OTHER): Payer: BC Managed Care – PPO | Admitting: Family Medicine

## 2021-08-30 VITALS — BP 128/94 | HR 81 | Temp 98.4°F | Ht 64.0 in | Wt 145.0 lb

## 2021-08-30 DIAGNOSIS — J309 Allergic rhinitis, unspecified: Secondary | ICD-10-CM | POA: Diagnosis not present

## 2021-08-30 DIAGNOSIS — Z87440 Personal history of urinary (tract) infections: Secondary | ICD-10-CM | POA: Diagnosis not present

## 2021-08-30 DIAGNOSIS — R7989 Other specified abnormal findings of blood chemistry: Secondary | ICD-10-CM

## 2021-08-30 DIAGNOSIS — Z23 Encounter for immunization: Secondary | ICD-10-CM | POA: Diagnosis not present

## 2021-08-30 DIAGNOSIS — Z0000 Encounter for general adult medical examination without abnormal findings: Secondary | ICD-10-CM

## 2021-08-30 DIAGNOSIS — R0982 Postnasal drip: Secondary | ICD-10-CM

## 2021-08-30 DIAGNOSIS — R030 Elevated blood-pressure reading, without diagnosis of hypertension: Secondary | ICD-10-CM | POA: Insufficient documentation

## 2021-08-30 DIAGNOSIS — Z1231 Encounter for screening mammogram for malignant neoplasm of breast: Secondary | ICD-10-CM | POA: Diagnosis not present

## 2021-08-30 DIAGNOSIS — Z13220 Encounter for screening for lipoid disorders: Secondary | ICD-10-CM

## 2021-08-30 NOTE — Progress Notes (Signed)
Annual Physical Exam Visit  Patient Information:  Patient ID: Anita Miller, female DOB: 11-20-1976 Age: 45 y.o. MRN: [id number]   Subjective:   CC: Annual Physical Exam  HPI:  Anita Miller is here for their annual physical.  I reviewed the past medical history, family history, social history, surgical history, and allergies today and changes were made as necessary.  Please see the problem list section below for additional details.  Past Medical History: Past Medical History:  Diagnosis Date   Epilepsy [facility])    Past Surgical History: Past Surgical History:  Procedure Laterality Date   COSMETIC SURGERY  08/01/2020   WISDOM TOOTH EXTRACTION Bilateral 2012   Family History: Family History  Problem Relation Age of Onset   Healthy Mother    Healthy Father    Asthma Daughter    Seizures Son    Allergies: No Known Allergies Health Maintenance: Health Maintenance  Topic Date Due   COVID-19 Vaccine (1) 09/15/2021 (Originally 07/17/1976)   INFLUENZA VACCINE  03/21/2022 (Originally 07/22/2021)   PAP SMEAR-Modifier  06/07/2022   TETANUS/TDAP  08/31/2031   Hepatitis C Screening  Completed   HIV Screening  Completed   Pneumococcal Vaccine 71-46 Years old  Aged Out   HPV VACCINES  Aged Out    HM Colonoscopy     This patient has no relevant Health Maintenance data.      Medications: Current Outpatient Medications on File Prior to Visit  Medication Sig Dispense Refill   lamoTRIgine (LAMICTAL) 100 MG tablet Take 100 mg by mouth daily.     norelgestromin-ethinyl estradiol Marilu Favre) 150-35 MCG/24HR transdermal patch Place 1 patch onto the skin once a week. 3 patch 12   No current facility-administered medications on file prior to visit.    Review of Systems: No headache, visual changes, nausea, vomiting, diarrhea, constipation, dizziness, abdominal pain, skin rash, fevers, chills, night sweats, swollen lymph nodes, weight loss, chest pain, body aches, joint  swelling, muscle aches, shortness of breath, mood changes, visual or auditory hallucinations reported. + Dry cough at night  Objective:   Vitals:   08/30/21 1019  BP: (!) 128/94  Pulse: 81  Temp: 98.4 F (36.9 C)  SpO2: 99%   Vitals:   08/30/21 1019  Weight: 145 lb (65.8 kg)  Height: 5' 4"  (1.626 m)   Body mass index is 24.89 kg/m.  General: Well Developed, well nourished, and in no acute distress.  Neuro: Alert and oriented x3, extra-ocular muscles intact, sensation grossly intact. Cranial nerves II through XII are intact, motor, sensory, and coordinative functions are all intact.  Diminished right lower extremity patellar and Achilles reflex, contralateral benign, symmetric absence of bilateral triceps and brachial radialis reflexes HEENT: Normocephalic, atraumatic, pupils equal round reactive to light, neck supple, no masses, no lymphadenopathy, thyroid nonpalpable.  Bilateral swollen nasal turbinates with mucus, otherwise oropharynx, nasopharynx, external ear canals are unremarkable. Skin: Warm and dry, no rashes noted.  Cardiac: Regular rate and rhythm, no murmurs rubs or gallops. No peripheral edema. Pulses symmetric. Respiratory: Clear to auscultation bilaterally. Not using accessory muscles, speaking in full sentences.  Abdominal: Soft, nontender, nondistended, positive bowel sounds, no masses, no organomegaly. Musculoskeletal: Shoulder, elbow, wrist, hip, knee, ankle stable, and with full range of motion.  Female chaperone initials: BN present throughout the physical examination.  Impression and Recommendations:   The patient was counselled, risk factors were discussed, and anticipatory guidance given.  Allergic rhinitis with postnasal drip Patient has noted interval resolution of her pharyngitis  following previous treatments, had noted brief sensation of throat swelling which spontaneously resolved.  She denies any fevers or chills, has been noting significant  dry/nonproductive and hacking cough when laying in bed, this awakens her from sleep.  Denies any shortness of air or tightness in the chest.  Given her findings I have advised OTC Flonase and antihistamine x7 days then as needed/seasonal dosing.  She can contact us for any issues or recalcitrant symptoms and otherwise follow-up as needed for this issue.  Elevated blood pressure reading without diagnosis of hypertension Interval improvement noted, she is asymptomatic from a cardiac standpoint, and restratification labs were ordered today, we will recheck her BP readings at follow-up in 3 months.  Annual physical exam Annual physical examination completed, risk stratification labs ordered, Tdap administered, mammogram ordered, anticipatory guidance provided.  History of UTI Recent dysuria, urinalysis with microscopy and urine culture ordered today, will follow results and manage accordingly.  Orders & Medications Medications: No orders of the defined types were placed in this encounter.  Orders Placed This Encounter  Procedures   Urine Culture   MM Digital Screening   Tdap vaccine greater than or equal to 7yo IM   TSH Rfx on Abnormal to Free T4   Lipid panel   Comprehensive metabolic panel   CBC   VITAMIN D 25 Hydroxy (Vit-D Deficiency, Fractures)   Urinalysis, Routine w reflex microscopic     Return in about 3 months (around 11/29/2021) for f/u blood pressure.    Montel Culver, MD   Primary Care Sports [facility]

## 2021-08-30 NOTE — Assessment & Plan Note (Signed)
Interval improvement noted, she is asymptomatic from a cardiac standpoint, and restratification labs were ordered today, we will recheck her BP readings at follow-up in 3 months.

## 2021-08-30 NOTE — Patient Instructions (Signed)
-   Start Flonase (fluticasone) and use daily x 7 days - Start over-the-counter antihistamine (Claritin, Zyrtec, Allegra, etc.) at use daily x7 days - After 7 days if symptoms recur, use both medications throughout the season  - Obtain fasting labs with orders provided (can have water or black coffee but otherwise no food or drink x 8 hours before labs) - Review information provided - Attend eye doctor once per year, dentist every 6 months, work towards or maintain 30 minutes of moderate intensity physical activity, and consume a balanced diet - Return in 3 months for blood pressure check - Contact us for any questions between now and then

## 2021-08-30 NOTE — Assessment & Plan Note (Signed)
Patient has noted interval resolution of her pharyngitis following previous treatments, had noted brief sensation of throat swelling which spontaneously resolved.  She denies any fevers or chills, has been noting significant dry/nonproductive and hacking cough when laying in bed, this awakens her from sleep.  Denies any shortness of air or tightness in the chest.  Given her findings I have advised OTC Flonase and antihistamine x7 days then as needed/seasonal dosing.  She can contact us for any issues or recalcitrant symptoms and otherwise follow-up as needed for this issue.

## 2021-08-30 NOTE — Assessment & Plan Note (Signed)
Annual physical examination completed, risk stratification labs ordered, Tdap administered, mammogram ordered, anticipatory guidance provided.

## 2021-08-30 NOTE — Assessment & Plan Note (Signed)
Recent dysuria, urinalysis with microscopy and urine culture ordered today, will follow results and manage accordingly.

## 2021-08-31 LAB — COMPREHENSIVE METABOLIC PANEL
ALT: 11 IU/L (ref 0–32)
AST: 19 IU/L (ref 0–40)
Albumin/Globulin Ratio: 1.7 (ref 1.2–2.2)
Albumin: 4.5 g/dL (ref 3.8–4.8)
Alkaline Phosphatase: 81 IU/L (ref 44–121)
BUN/Creatinine Ratio: 6 — ABNORMAL LOW (ref 9–23)
BUN: 6 mg/dL (ref 6–24)
Bilirubin Total: 0.3 mg/dL (ref 0.0–1.2)
CO2: 25 mmol/L (ref 20–29)
Calcium: 9.4 mg/dL (ref 8.7–10.2)
Chloride: 99 mmol/L (ref 96–106)
Creatinine, Ser: 0.94 mg/dL (ref 0.57–1.00)
Globulin, Total: 2.6 g/dL (ref 1.5–4.5)
Glucose: 87 mg/dL (ref 65–99)
Potassium: 4 mmol/L (ref 3.5–5.2)
Sodium: 139 mmol/L (ref 134–144)
Total Protein: 7.1 g/dL (ref 6.0–8.5)
eGFR: 77 mL/min/{1.73_m2} (ref >59)

## 2021-08-31 LAB — URINALYSIS, ROUTINE W REFLEX MICROSCOPIC
Bilirubin, UA: NEGATIVE
Glucose, UA: NEGATIVE
Ketones, UA: NEGATIVE
Leukocytes,UA: NEGATIVE
Nitrite, UA: NEGATIVE
Specific Gravity, UA: 1.025 (ref 1.005–1.030)
Urobilinogen, Ur: 0.2 mg/dL (ref 0.2–1.0)
pH, UA: 7.5 (ref 5.0–7.5)

## 2021-08-31 LAB — LIPID PANEL
Chol/HDL Ratio: 3.5 ratio (ref 0.0–4.4)
Cholesterol, Total: 246 mg/dL — ABNORMAL HIGH (ref 100–199)
HDL: 70 mg/dL (ref >39)
LDL Chol Calc (NIH): 149 mg/dL — ABNORMAL HIGH (ref 0–99)
Triglycerides: 154 mg/dL — ABNORMAL HIGH (ref 0–149)
VLDL Cholesterol Cal: 27 mg/dL (ref 5–40)

## 2021-08-31 LAB — VITAMIN D 25 HYDROXY (VIT D DEFICIENCY, FRACTURES): Vit D, 25-Hydroxy: 14.4 ng/mL — ABNORMAL LOW (ref 30.0–100.0)

## 2021-08-31 LAB — CBC
Hematocrit: 40.7 % (ref 34.0–46.6)
Hemoglobin: 14.1 g/dL (ref 11.1–15.9)
MCH: 30.6 pg (ref 26.6–33.0)
MCHC: 34.6 g/dL (ref 31.5–35.7)
MCV: 88 fL (ref 79–97)
Platelets: 461 10*3/uL — ABNORMAL HIGH (ref 150–450)
RBC: 4.61 x10E6/uL (ref 3.77–5.28)
RDW: 12.4 % (ref 11.7–15.4)
WBC: 5.8 10*3/uL (ref 3.4–10.8)

## 2021-08-31 LAB — TSH RFX ON ABNORMAL TO FREE T4: TSH: 1.62 u[IU]/mL (ref 0.450–4.500)

## 2021-08-31 LAB — MICROSCOPIC EXAMINATION
Epithelial Cells (non renal): >10 /hpf — AB (ref 0–10)
RBC, Urine: NONE SEEN /hpf (ref 0–2)

## 2021-09-01 LAB — URINE CULTURE

## 2021-09-02 ENCOUNTER — Other Ambulatory Visit: Payer: Self-pay | Admitting: Family Medicine

## 2021-09-02 DIAGNOSIS — R7989 Other specified abnormal findings of blood chemistry: Secondary | ICD-10-CM

## 2021-09-02 MED ORDER — VITAMIN D (ERGOCALCIFEROL) 1.25 MG (50000 UNIT) PO CAPS: 50000.0000 [IU] | ORAL_CAPSULE | ORAL | 0 refills | Status: DC

## 2021-09-02 NOTE — Progress Notes (Signed)
Ms. Rhody, the labs came back and are significant for your cholesterol and triglycerides being elevated, they have increased since this was last checked a year ago. Given your other health factors - this is best addressed with lifestyle (diet & exercise) changes at this stage. Review the information below in addition to what was given at the end of your recent visit.  Diet & Exercise Recommendations Dietary changes to include reducing saturated fats, sodium, avoiding red meat, fried, processed foods, full-fat dairy, baked goods, and sweets. Incorporate more fruits, vegetables, fiber-rich foods such as whole grains, and transition to more eggs and lean meats. Make realistic changes where you can and stick to it!  Physical activity should be focused on getting 150 to 300 minutes per week of moderate to vigorous activity (30 minutes of activity at least 5 days of the week at a level of intensity where you can carry on a conversation without being out of breath). However, any increase in activity is beneficial to your health! Physical activity reduces symptoms of depression and anxiety and improves sleep quality (increase the time in deep sleep and reduce daytime sleepiness). Benefits include weight loss, improved insulin sensitivity, less adiposity, and increased bone health. Walking lowers systolic and diastolic blood pressures as well as your resting heart rate.  Cognitive benefits of exercise include short-term improvements in executive function, memory, processing speed, attention, and academic performance Increasing physical activity as we age can help [place] maintain independence by reducing cognitive decline and falls.  The lab results show that your vitamin D level is low enough that you will require a prescription supplement for 8 weeks.  Lastly, the urine results came back showing signs of infection but were not conclusive. If you are without symptoms, we can watch this. If you have any new or  worsening symptoms, please reach out to the office for next steps.

## 2021-09-02 NOTE — Telephone Encounter (Signed)
Please advise for medication management.

## 2021-10-08 ENCOUNTER — Other Ambulatory Visit: Payer: Self-pay

## 2021-10-08 MED ORDER — XULANE 150-35 MCG/24HR TD PTWK: 1.0000 | MEDICATED_PATCH | TRANSDERMAL | 3 refills | Status: DC

## 2021-10-28 NOTE — Progress Notes (Signed)
error 

## 2021-11-08 ENCOUNTER — Other Ambulatory Visit: Payer: Self-pay | Admitting: Family Medicine

## 2021-11-08 DIAGNOSIS — R7989 Other specified abnormal findings of blood chemistry: Secondary | ICD-10-CM

## 2021-11-08 NOTE — Telephone Encounter (Signed)
Requested medications are due for refill today yes  Requested medications are on the active medication list yes  Last refill 09/02/21  Last visit 08/30/21  Future visit scheduled 11/24/21  Notes to clinic This medication can not be delegated, please assess.  Requested Prescriptions  Pending Prescriptions Disp Refills   Vitamin D, Ergocalciferol, (DRISDOL) 1.25 MG (50000 UNIT) CAPS capsule [Pharmacy Med Name: VITAMIN D2 50,000IU (ERGO) CAP RX] 8 capsule 0    Sig: TAKE 1 CAPSULE BY MOUTH EVERY 7 DAYS     Endocrinology:  Vitamins - Vitamin D Supplementation Failed - 11/08/2021  2:18 PM      Failed - 50,000 IU strengths are not delegated      Failed - Phosphate in normal range and within 360 days    No results found for: PHOS        Failed - Vitamin D in normal range and within 360 days    Vit D, 25-Hydroxy  Date Value Ref Range Status  08/30/2021 14.4 (L) 30.0 - 100.0 ng/mL Final    Comment:    Vitamin D deficiency has been defined by the Institute of Medicine and an Endocrine Society practice guideline as a level of serum 25-OH vitamin D less than 20 ng/mL (1,2). The Endocrine Society went on to further define vitamin D insufficiency as a level between 21 and 29 ng/mL (2). 1. IOM (Institute of Medicine). 2010. Dietary reference    intakes for calcium and D. [place]: The    [employer]. 2. Holick MF, Binkley [place], Bischoff-Ferrari HA, et al.    Evaluation, treatment, and prevention of vitamin D    deficiency: an Endocrine Society clinical practice    guideline. JCEM. 2011 Jul; 96(7):1911-30.           Passed - Ca in normal range and within 360 days    Calcium  Date Value Ref Range Status  08/30/2021 9.4 8.7 - 10.2 mg/dL Final          Passed - Valid encounter within last 12 months    Recent Outpatient Visits           2 months ago Encounter for screening mammogram for malignant neoplasm of breast   [facility] Montel Culver, MD   3  months ago Pharyngitis, unspecified etiology   [facility] Montel Culver, MD       Future Appointments             In 3 weeks Zigmund Daniel, Earley Abide, MD [facility], [place]   In 1 month Rubie Maid, MD [facility]

## 2021-11-29 ENCOUNTER — Ambulatory Visit: Payer: BC Managed Care – PPO | Admitting: Family Medicine

## 2021-12-02 ENCOUNTER — Other Ambulatory Visit: Payer: Self-pay | Admitting: Obstetrics and Gynecology

## 2021-12-02 DIAGNOSIS — Z1231 Encounter for screening mammogram for malignant neoplasm of breast: Secondary | ICD-10-CM

## 2021-12-04 ENCOUNTER — Ambulatory Visit: Payer: BC Managed Care – PPO | Admitting: Family Medicine

## 2021-12-04 ENCOUNTER — Encounter: Payer: Self-pay | Admitting: Family Medicine

## 2021-12-04 ENCOUNTER — Other Ambulatory Visit: Payer: Self-pay

## 2021-12-04 VITALS — BP 140/98 | HR 79 | Ht 64.0 in | Wt 143.0 lb

## 2021-12-04 DIAGNOSIS — E785 Hyperlipidemia, unspecified: Secondary | ICD-10-CM

## 2021-12-04 DIAGNOSIS — I10 Essential (primary) hypertension: Secondary | ICD-10-CM | POA: Diagnosis not present

## 2021-12-04 MED ORDER — AMLODIPINE BESY-BENAZEPRIL HCL 5-10 MG PO CAPS: 1.0000 | ORAL_CAPSULE | Freq: Every day | ORAL | 2 refills | Status: DC

## 2021-12-04 NOTE — Patient Instructions (Addendum)
-   Start blood pressure medication - Nutritionist referral coordinator will contact you for scheduling - Incorporate healthy diet and exercise changes - Return in 1 month

## 2021-12-04 NOTE — Assessment & Plan Note (Signed)
Noted in the setting of hypertension, lifestyle modifications reviewed, additionally referral to nutrition was placed for further optimization.

## 2021-12-04 NOTE — Progress Notes (Signed)
°  ° °  Primary Care / Sports Medicine Office Visit  Patient Information:  Patient ID: Anita Miller, female DOB: 1976/04/05 Age: 45 y.o. MRN: [id number]   Anita Miller is a pleasant 45 y.o. female presenting with the following:  Chief Complaint  Patient presents with   Follow-up   Hypertension    Patient Active Problem List   Diagnosis Date Noted   Essential (primary) hypertension 12/04/2021   Hyperlipidemia 12/04/2021   Allergic rhinitis with postnasal drip 08/30/2021   Annual physical exam 08/30/2021   History of UTI 08/30/2021   Seizure ([place]) 12/08/2018    Vitals:   12/04/21 0950 12/04/21 1005  BP: (!) 144/94 (!) 140/98  Pulse: 79   SpO2: 99%    Vitals:   12/04/21 0950  Weight: 143 lb (64.9 kg)  Height: 5' 4"  (1.626 m)   Body mass index is 24.55 kg/m.  No results found.   Independent interpretation of notes and tests performed by another provider:   None  Procedures performed:   None  Pertinent History, Exam, Impression, and Recommendations:   Essential (primary) hypertension Repeated blood pressure elevations noted, given her recent serum studies revealing mixed hyperlipidemia, I have advised initiation of amlodipine- benazepril 5-10 mg daily, additionally referral to nutritionist was placed for further optimization.  Her cardiopulmonary findings today are benign including a regular rate and rhythm, positive S1 and S2, clear lung fields throughout, no peripheral edema, no JVD, no carotid bruits noted.  We will maintain close follow-up in 1 month's time to assess need for titration.  Hyperlipidemia Noted in the setting of hypertension, lifestyle modifications reviewed, additionally referral to nutrition was placed for further optimization.   Orders & Medications Meds ordered this encounter  Medications   amLODipine-benazepril (LOTREL) 5-10 MG capsule    Sig: Take 1 capsule by mouth daily.    Dispense:  30 capsule    Refill:  2   Orders Placed  This Encounter  Procedures   Amb ref to Medical Nutrition Therapy-MNT     Return in about 4 weeks (around 01/01/2022).     Montel Culver, MD   Primary Care Sports Medicine [place]

## 2021-12-04 NOTE — Assessment & Plan Note (Signed)
Repeated blood pressure elevations noted, given her recent serum studies revealing mixed hyperlipidemia, I have advised initiation of amlodipine- benazepril 5-10 mg daily, additionally referral to nutritionist was placed for further optimization.  Her cardiopulmonary findings today are benign including a regular rate and rhythm, positive S1 and S2, clear lung fields throughout, no peripheral edema, no JVD, no carotid bruits noted.  We will maintain close follow-up in 1 month's time to assess need for titration.

## 2021-12-24 ENCOUNTER — Ambulatory Visit (INDEPENDENT_AMBULATORY_CARE_PROVIDER_SITE_OTHER): Payer: BC Managed Care – PPO | Admitting: Obstetrics and Gynecology

## 2021-12-24 ENCOUNTER — Other Ambulatory Visit: Payer: Self-pay

## 2021-12-24 ENCOUNTER — Encounter: Payer: Self-pay | Admitting: Obstetrics and Gynecology

## 2021-12-24 VITALS — BP 132/82 | Ht 61.5 in | Wt 143.8 lb

## 2021-12-24 DIAGNOSIS — I10 Essential (primary) hypertension: Secondary | ICD-10-CM

## 2021-12-24 DIAGNOSIS — Z01419 Encounter for gynecological examination (general) (routine) without abnormal findings: Secondary | ICD-10-CM | POA: Diagnosis not present

## 2021-12-24 DIAGNOSIS — Z3045 Encounter for surveillance of transdermal patch hormonal contraceptive device: Secondary | ICD-10-CM

## 2021-12-24 DIAGNOSIS — Z1211 Encounter for screening for malignant neoplasm of colon: Secondary | ICD-10-CM

## 2021-12-24 MED ORDER — MEDROXYPROGESTERONE ACETATE 150 MG/ML IM SUSP: 150.0000 mg | INTRAMUSCULAR | 3 refills | Status: AC

## 2021-12-24 NOTE — Progress Notes (Signed)
GYNECOLOGY ANNUAL PHYSICAL EXAM PROGRESS NOTE  Subjective:    Anita Miller is a 46 y.o. [id number] female who presents for an annual exam. The patient is not currently sexually active. The patient wears seatbelts: yes. The patient participates in regular exercise: yes (fairly active). Has the patient ever been transfused or tattooed?: yes (professional tattoos). The patient reports that there is not domestic violence in her life.   The patient has the following complaints today: Notes that she feels right breast pain (occasionally bilateral) sometimes when changing her contraceptive patch, or more often with onset or completion of her menses.  Desires to discuss contraception. Would like to consider returning to Depo Provera. Sometimes has issues with the patch sticking.  Notes she was recently diagnosed with HTN, started on Amlodipine 2 weeks ago.   Menstrual History: Menarche age: 43 Patient's last menstrual period was 12/10/2021. Period Cycle (Days): 28 Period Duration (Days): 3 Period Pattern: Regular Menstrual Flow: Moderate Menstrual Control: Maxi pad Dysmenorrhea: None  Gynecologic History: Contraception:  Xulane patch History of STI's: Denies Last Pap: 2020. Results were: normal.  Reports remote h/o abnormal pap smears  X 1. Last mammogram: 2019. Results were: normal. Scheduled for this month.    OB History  Gravida Para Term Preterm AB Living  2 2 2  0 0 2  SAB IAB Ectopic Multiple Live Births  0 0 0 0 2    # Outcome Date GA Lbr Len/2nd Weight Sex Delivery Anes PTL Lv  2 Term 02/14/06    M Vag-Spont   LIV  1 Term 09/30/00    F Vag-Spont   LIV    Past Medical History:  Diagnosis Date   Epilepsy [facility])     Past Surgical History:  Procedure Laterality Date   COSMETIC SURGERY  08/01/2020   WISDOM TOOTH EXTRACTION Bilateral 2012    Family History  Problem Relation Age of Onset   Healthy Mother    Healthy Father    Asthma Daughter    Seizures Son      Social History   Socioeconomic History   Marital status: Single    Spouse name: Not on file   Number of children: 2   Years of education: 18   Highest education level: Master's degree (e.g., MA, MS, MEng, MEd, MSW, MBA)  Occupational History   Occupation: [employer] officer, museum  Tobacco Use   Smoking status: Never   Smokeless tobacco: Never  Vaping Use   Vaping Use: Never used  Substance and Sexual Activity   Alcohol use: Not Currently   Drug use: Yes    Types: Marijuana   Sexual activity: Yes    Partners: Male    Birth control/protection: Patch  Other Topics Concern   Not on file  Social History Narrative   Education officer, museum, 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 30 capsule 2   lamoTRIgine (LAMICTAL) 100 MG tablet Take 100 mg by mouth daily.     No current facility-administered medications on file prior to visit.    No Known Allergies  Review of Systems Constitutional: negative for chills, fatigue, fevers and sweats Eyes: negative for irritation, redness and visual disturbance Ears, nose, mouth, throat, and face: negative for hearing loss, nasal congestion, snoring and tinnitus Respiratory: negative for asthma, cough, sputum Cardiovascular: negative for chest pain, dyspnea, exertional chest pressure/discomfort, irregular heart beat, palpitations and syncope Gastrointestinal: negative for abdominal pain, change in bowel habits, nausea and vomiting Genitourinary: negative for abnormal menstrual periods, genital lesions, sexual problems and vaginal  discharge, dysuria and urinary incontinence Integument/breast: negative for breast lump, and nipple discharge. + breast tenderness  Hematologic/lymphatic: negative for bleeding and easy bruising Musculoskeletal:negative for back pain and muscle weakness Neurological: negative for dizziness, headaches, vertigo and weakness Endocrine: negative for diabetic symptoms including polydipsia, polyuria and skin dryness Allergic/Immunologic: negative for hay fever and urticaria        Objective:  Blood pressure 132/82, height 5' 1.5" (1.562 m), weight 143 lb 12.8 oz (65.2 kg), last menstrual period 12/10/2021. Body mass index is 26.73 kg/m.  Repeat BP 124/86.   General Appearance:    Alert, cooperative, no distress, appears stated age  Head:    Normocephalic, without obvious abnormality, atraumatic  Eyes:    PERRL, conjunctiva/corneas clear, EOM's intact, both eyes  Ears:    Normal external ear canals, both ears  Nose:   Nares normal, septum midline, mucosa normal, no drainage or sinus tenderness  Throat:   Lips, mucosa, and tongue normal; teeth and gums normal  Neck:   Supple, symmetrical, trachea midline, no adenopathy; thyroid: no enlargement/tenderness/nodules; no carotid bruit or JVD  Back:     Symmetric, no curvature, ROM normal, no CVA tenderness  Lungs:     Clear to auscultation bilaterally, respirations unlabored  Chest Wall:    No tenderness or deformity   Heart:    Regular rate and rhythm, S1 and S2 normal, no murmur, rub or gallop  Breast Exam:    No tenderness, masses, or nipple abnormality  Abdomen:     Soft, non-tender, bowel sounds active all four quadrants, no masses, no organomegaly.    Genitalia:    Pelvic:external genitalia normal, vagina without lesions, discharge, or tenderness, rectovaginal septum  normal. Cervix normal in appearance, no cervical motion tenderness, no adnexal masses or tenderness.  Uterus normal size, shape, mobile, regular contours, nontender.  Rectal:     Normal external sphincter.  No hemorrhoids appreciated. Internal exam not done.   Extremities:   Extremities normal, atraumatic, no cyanosis or edema  Pulses:   2+ and symmetric all extremities  Skin:   Skin color, texture, turgor normal, no rashes or lesions  Lymph nodes:   Cervical, supraclavicular, and axillary nodes normal  Neurologic:   CNII-XII intact, normal strength, sensation and reflexes throughout   .  Labs:  Lab Results  Component Value Date   WBC 5.8 08/30/2021   HGB 14.1 08/30/2021   HCT 40.7 08/30/2021   MCV 88 08/30/2021   PLT 461 (H) 08/30/2021    Lab Results  Component Value Date   CREATININE 0.94 08/30/2021   BUN 6 08/30/2021   NA 139 08/30/2021   K 4.0 08/30/2021   CL 99 08/30/2021   CO2 25 08/30/2021    Lab Results  Component Value Date   ALT 11 08/30/2021   AST 19 08/30/2021   ALKPHOS 81 08/30/2021   BILITOT 0.3 08/30/2021    Lab Results  Component Value Date   TSH 1.620 08/30/2021     Assessment:   1. Colon cancer screening  2. Encounter for well woman exam with routine gynecological exam   3. Essential (primary) hypertension   4. Encounter for surveillance of transdermal patch hormonal contraceptive device      Plan:     Blood tests: Labs reviewed Breast self exam technique reviewed and patient encouraged to perform self-exam monthly. No significant findings on exam today. May be hormonally related.  Contraception: Xulane patches weekly. Desires to change contraception, would like to return to Depo injections. Will prescribe. To return in 1 week for injection.  Discussed healthy lifestyle modifications. Mammogram scheduled for later this month. Pap smear due in 1 year.  Colon cancer screening: Will need to begin colon screenings at age 52 (in 83 weeks). Discussed options, patient would like to perform Cologuard. Will order.  Hypertension, newly diagnosed and managed by PCP, on Amlodipine.  Declines flu vaccine Declines COVID  vaccine.  Follow up in 1 year for annual exam. Return in 1 week for Depo Provera injection.     Rubie Maid, MD [facility]

## 2022-01-01 ENCOUNTER — Ambulatory Visit: Payer: BC Managed Care – PPO | Admitting: Family Medicine

## 2022-01-01 ENCOUNTER — Encounter: Payer: Self-pay | Admitting: Family Medicine

## 2022-01-01 ENCOUNTER — Other Ambulatory Visit: Payer: Self-pay

## 2022-01-01 VITALS — BP 114/86 | HR 99 | Ht 64.0 in | Wt 139.0 lb

## 2022-01-01 DIAGNOSIS — I10 Essential (primary) hypertension: Secondary | ICD-10-CM | POA: Diagnosis not present

## 2022-01-01 DIAGNOSIS — E785 Hyperlipidemia, unspecified: Secondary | ICD-10-CM

## 2022-01-01 MED ORDER — AMLODIPINE BESY-BENAZEPRIL HCL 5-10 MG PO CAPS: 1.0000 | ORAL_CAPSULE | Freq: Every day | ORAL | 0 refills | Status: DC

## 2022-01-01 NOTE — Progress Notes (Signed)
°  ° °  Primary Care / Sports Medicine Office Visit  Patient Information:  Patient ID: Anita Miller, female DOB: [birth year] Age: 46 y.o. MRN: [id number]   Anita Miller is a pleasant 46 y.o. female presenting with the following:  Chief Complaint  Patient presents with   Hypertension    Pt is tolerated medication well, pt stated BP has lowered , was 132/84 at OBGYN last week     Patient Active Problem List   Diagnosis Date Noted   Essential (primary) hypertension 12/04/2021   Hyperlipidemia 12/04/2021   Allergic rhinitis with postnasal drip 08/30/2021   Annual physical exam 08/30/2021   History of UTI 08/30/2021   Seizure ([place]) 12/08/2018    Vitals:   01/01/22 0942  BP: 114/86  Pulse: 99  SpO2: 98%   Vitals:   01/01/22 0942  Weight: 139 lb (63 kg)  Height: 5' 4"  (1.626 m)   Body mass index is 23.86 kg/m.  No results found.   Independent interpretation of notes and tests performed by another provider:   None  Procedures performed:   None  Pertinent History, Exam, Impression, and Recommendations:   Essential (primary) hypertension History of multiple elevated blood pressure readings in the setting of hyperlipidemia.  She was started on amlodipine benazepril 5-10 mg daily and we placed a referral to nutritionist at the last visit.  She has been tolerating the medication well without reported adverse effects including fatigue, headache, chest pain, shortness of air, cough.  Today's blood pressure readings demonstrate significant improvement and are in normal range.  Her physical examination findings are benign.  At this stage I have advised her on various medication management options and we will continue at her current dose, have the patient return in 90 days, and I have stressed continued work towards lifestyle/nutrition modification.  A new referral was placed as the last one went to a nutritionist that was out of network.  Chronic condition that is stable,  medication management  Hyperlipidemia Patient with known hyperlipidemia and hypertension, latter recently demonstrated control with medication initiation.  She was educated on lifestyle modifications to help lower her cholesterols and associated cardiovascular risk.  We did place a referral to medical nutritionist at the last visit however this was set with a group outside of her insurance coverage network.  A new referral was placed today within the [facility] health network.  Chronic condition that is stable   Orders & Medications Meds ordered this encounter  Medications   amLODipine-benazepril (LOTREL) 5-10 MG capsule    Sig: Take 1 capsule by mouth daily.    Dispense:  90 capsule    Refill:  0   Orders Placed This Encounter  Procedures   Amb ref to Medical Nutrition Therapy-MNT     Return in about 3 months (around 04/01/2022).     Montel Culver, MD   Primary Care Sports Medicine [place]

## 2022-01-01 NOTE — Patient Instructions (Signed)
-   Continue current dose of blood pressure medication - Make healthy lifestyle changes where applicable - We will reach out to nutritionist again - Return in 3 months

## 2022-01-01 NOTE — Assessment & Plan Note (Signed)
History of multiple elevated blood pressure readings in the setting of hyperlipidemia.  She was started on amlodipine benazepril 5-10 mg daily and we placed a referral to nutritionist at the last visit.  She has been tolerating the medication well without reported adverse effects including fatigue, headache, chest pain, shortness of air, cough.  Today's blood pressure readings demonstrate significant improvement and are in normal range.  Her physical examination findings are benign.  At this stage I have advised her on various medication management options and we will continue at her current dose, have the patient return in 90 days, and I have stressed continued work towards lifestyle/nutrition modification.  A new referral was placed as the last one went to a nutritionist that was out of network.  Chronic condition that is stable, medication management

## 2022-01-01 NOTE — Assessment & Plan Note (Signed)
Patient with known hyperlipidemia and hypertension, latter recently demonstrated control with medication initiation.  She was educated on lifestyle modifications to help lower her cholesterols and associated cardiovascular risk.  We did place a referral to medical nutritionist at the last visit however this was set with a group outside of her insurance coverage network.  A new referral was placed today within the [facility] health network.  Chronic condition that is stable

## 2022-01-03 ENCOUNTER — Other Ambulatory Visit: Payer: Self-pay

## 2022-01-03 ENCOUNTER — Ambulatory Visit (INDEPENDENT_AMBULATORY_CARE_PROVIDER_SITE_OTHER): Payer: BC Managed Care – PPO | Admitting: Obstetrics and Gynecology

## 2022-01-03 DIAGNOSIS — Z3042 Encounter for surveillance of injectable contraceptive: Secondary | ICD-10-CM | POA: Diagnosis not present

## 2022-01-03 MED ORDER — MEDROXYPROGESTERONE ACETATE 150 MG/ML IM SUSP
150.0000 mg | INTRAMUSCULAR | Status: AC
  Administered 2022-01-03 – 2022-03-14 (×2): 150 mg via INTRAMUSCULAR

## 2022-01-03 NOTE — Progress Notes (Signed)
Date last pap: 06/08/2019. Last Depo-Provera: 01/03/2022. Side Effects if any: First dose Serum HCG indicated? Negative. Depo-Provera 150 mg IM given by: Cristy Folks, CMA. Next appointment due: March 13 - April 14

## 2022-01-05 ENCOUNTER — Encounter: Payer: Self-pay | Admitting: Obstetrics and Gynecology

## 2022-01-06 ENCOUNTER — Other Ambulatory Visit: Payer: Self-pay

## 2022-01-06 ENCOUNTER — Ambulatory Visit
Admission: RE | Admit: 2022-01-06 | Discharge: 2022-01-06 | Disposition: A | Discharge status: Home / Self Care | Payer: BC Managed Care – PPO | Source: Ambulatory Visit | Attending: Obstetrics and Gynecology | Admitting: Obstetrics and Gynecology

## 2022-01-06 DIAGNOSIS — Z1231 Encounter for screening mammogram for malignant neoplasm of breast: Secondary | ICD-10-CM | POA: Diagnosis present

## 2022-01-06 LAB — POCT URINE PREGNANCY: Preg Test, Ur: NEGATIVE

## 2022-01-07 ENCOUNTER — Inpatient Hospital Stay: Admission: RE | Admit: 2022-01-07 | Payer: Self-pay | Source: Ambulatory Visit

## 2022-01-07 ENCOUNTER — Other Ambulatory Visit: Payer: Self-pay | Admitting: Obstetrics and Gynecology

## 2022-01-07 DIAGNOSIS — N6320 Unspecified lump in the left breast, unspecified quadrant: Secondary | ICD-10-CM

## 2022-01-07 DIAGNOSIS — R928 Other abnormal and inconclusive findings on diagnostic imaging of breast: Secondary | ICD-10-CM

## 2022-01-09 ENCOUNTER — Encounter: Payer: Self-pay | Admitting: Obstetrics and Gynecology

## 2022-01-16 ENCOUNTER — Other Ambulatory Visit: Payer: Self-pay

## 2022-01-16 ENCOUNTER — Ambulatory Visit
Admission: RE | Admit: 2022-01-16 | Discharge: 2022-01-16 | Disposition: A | Discharge status: Home / Self Care | Payer: BC Managed Care – PPO | Source: Ambulatory Visit | Attending: Obstetrics and Gynecology | Admitting: Obstetrics and Gynecology

## 2022-01-16 DIAGNOSIS — N6320 Unspecified lump in the left breast, unspecified quadrant: Secondary | ICD-10-CM | POA: Insufficient documentation

## 2022-01-16 DIAGNOSIS — R928 Other abnormal and inconclusive findings on diagnostic imaging of breast: Secondary | ICD-10-CM

## 2022-02-12 ENCOUNTER — Telehealth: Payer: Self-pay | Admitting: Obstetrics and Gynecology

## 2022-02-12 NOTE — Telephone Encounter (Signed)
Someone from the lab called where colo guard samples are processed- inquiring about sample collection date and time, I confirmed phone number with them. They requested we reach out to pt and provide toll free # [phone]- pt support option to provide needed information. I attempted to call pt at [phone] where I was unable to LM due to voicemail not set up. I sent mychart message.

## 2022-02-22 LAB — COLOGUARD: COLOGUARD: NEGATIVE

## 2022-03-14 ENCOUNTER — Ambulatory Visit (INDEPENDENT_AMBULATORY_CARE_PROVIDER_SITE_OTHER): Payer: BC Managed Care – PPO | Admitting: Obstetrics and Gynecology

## 2022-03-14 ENCOUNTER — Other Ambulatory Visit: Payer: Self-pay

## 2022-03-14 ENCOUNTER — Encounter: Payer: BC Managed Care – PPO | Attending: Family Medicine | Admitting: Dietician

## 2022-03-14 ENCOUNTER — Encounter: Payer: Self-pay | Admitting: Dietician

## 2022-03-14 VITALS — Ht 62.0 in | Wt 140.3 lb

## 2022-03-14 DIAGNOSIS — I10 Essential (primary) hypertension: Secondary | ICD-10-CM

## 2022-03-14 DIAGNOSIS — Z3042 Encounter for surveillance of injectable contraceptive: Secondary | ICD-10-CM | POA: Diagnosis not present

## 2022-03-14 DIAGNOSIS — E785 Hyperlipidemia, unspecified: Secondary | ICD-10-CM

## 2022-03-14 NOTE — Progress Notes (Signed)
Medical Nutrition Therapy: Visit start time: 0930  end time: 0370  ?Assessment:  Diagnosis: hypertension, hyperlipidemia ?Past medical history: epilepsy ?Psychosocial issues/ stress concerns: none ? ?Preferred learning method:  ?Auditory ?Visual ?Hands-on ? ? ?Current weight: 140.3lbs Height: 5'2" BMI: 25.66 ? ?Medications, supplements: reconciled list in medical record ? ?Progress and evaluation:  ?Patient reports recent increases in BP and blood lipids; BP is currently well controlled with medication.  ?Most recent lab results (08/30/21) indicate: total cholesterol 246, HDL 70, LDL 149, triglycerides 154  ? ?Physical activity: full body workout youtube 20 minutes 5-6x a week ? ?Dietary Intake:  ?Usual eating pattern includes 1-3 meals and 3+ snacks per day. ?Dining out frequency: 10-15 meals per week. ? ?Breakfast: some fast food due to work schedule; cereal ie cheerios with oats and almonds/ Honey bunches of oats; loves pancakes and french toast at [employer], now limiting to once every 2 weeks ?Snack: sliced almonds (cause flatulence) ?Lunch: bagel and cream cheese; rice cakes; usually snacks instead of meal due to work ?Snack: rice krispie treats; ice cream ?Supper: time/ amount of food varies -- 3/23 bagel; eats out on Fridays -- tries to make healthy choices. ?Snack: same as others; likes bread, doughy snacks ie muffins, turnover pastry ?Beverages: water 6-7 cups daily; likes ginger ale, rarely coke -- tries to limit ? ?Nutrition Care Education: ?Topics covered:  ?Basic nutrition: basic food groups, appropriate nutrient balance, appropriate meal and snack schedule, general nutrition guidelines; Mediterranean eating pattern as best for heart health ?Weight control: importance of low sugar and low fat choices; portion control strategies including pre-portioning foods, avoiding eating from large containers, measuring foods; estimated energy needs for gradual weight loss at 1200 kcal, provided guidance for  50% CHO, 20% pro, 30% fat ?Advanced nutrition:  dining out ?Hypertension: importance of controlling sodium intake; identifying food sources of potassium, magnesium ?Hyperlipidemia:  target goals for lipids, healthy and unhealthy fats, role of fiber, plant sterols, role of exercise ? ? ?Nutritional Diagnosis:  [place]-2.1 Inpaired nutrition utilization and [place]-2.2 Altered nutrition-related laboratory As related to hypertension and hyperlipidemia.  As evidenced by BP controlled with medication, and elevated total cholesterol, LDL, and tryiglycerides. ? ?Intervention:  ?Instruction and discussion as noted above. ?Patient has been working on positive lifestyle changes, and is motivated to continue. ?Established additional goals for change with direction from patient; she feels she needs to work on better portion control and healthier snack choices. ?No follow up scheduled at this time; patient will schedule later if needed. ? ?Product/process development scientist given:  ?General diet guidelines for Cholesterol-lowering/ Heart health ?Plate Planner with food lists, sample meal pattern ?Sample menus ?Snacking handout ?Foods to Choose to Lower Cholesterol (AHA) ?Visit summary with goals/ instructions to be viewed via MyChart ? ? ?Learner/ who was taught:  ?Patient  ? ? ?Level of understanding: ?Verbalizes/ demonstrates competency ? ? ?Demonstrated degree of understanding via:   Teach back ?Learning barriers: ?None ? ?Willingness to learn/ readiness for change: ?Eager, change in progress ? ?Monitoring and Evaluation:  Dietary intake, exercise, BP control, blood lipids, and body weight ?     follow up: prn  ?

## 2022-03-14 NOTE — Patient Instructions (Signed)
Choose healthy snacks that are portable -- include some vegetables and fruits whenever possible, along with lean protein foods and controlled portions of starches.  ?Plan to eat a meal or snack every 3-5 hours during the day. Allow at least 2 hours between meals and snacks.  ?Control food portions in general, try measuring a few times to become familiar with the healthy portion, use small containers to pre-portions foods/ snacks ?Keep up the regular exercise, great job! ?

## 2022-03-14 NOTE — Progress Notes (Signed)
Date last pap: 06/08/2019. ?Last Depo-Provera: 01/03/2022. ?Side Effects if any: None. ?Serum HCG indicated? N/A. ?Depo-Provera 150 mg IM given by: Cristy Folks, CMA. ?Next appointment due: June 9 - June 23.  ?

## 2022-04-01 ENCOUNTER — Ambulatory Visit: Payer: BC Managed Care – PPO | Admitting: Family Medicine

## 2022-05-06 ENCOUNTER — Encounter: Payer: Self-pay | Admitting: Family Medicine

## 2022-05-06 ENCOUNTER — Telehealth: Payer: Self-pay | Admitting: Family Medicine

## 2022-05-06 ENCOUNTER — Other Ambulatory Visit: Payer: Self-pay | Admitting: Family Medicine

## 2022-05-06 DIAGNOSIS — I10 Essential (primary) hypertension: Secondary | ICD-10-CM

## 2022-05-06 MED ORDER — AMLODIPINE BESY-BENAZEPRIL HCL 5-10 MG PO CAPS: 1.0000 | ORAL_CAPSULE | Freq: Every day | ORAL | 0 refills | Status: DC

## 2022-05-06 NOTE — Telephone Encounter (Signed)
Message sent to pt, please advise pt to make an appointment with Dr. Zigmund Daniel within 30days.

## 2022-05-06 NOTE — Telephone Encounter (Signed)
Pt needs appointment before next refill ?

## 2022-05-06 NOTE — Telephone Encounter (Signed)
Copied from [place] [id number]. Topic: General - Other ?>> May 06, 2022  1:09 PM Tessa Lerner A wrote: ?Reason for CRM: The patient has called for to request a prior authorization for their amLODipine-benazepril (LOTREL) 5-10 MG capsule [[id number]]  ? ?The patient has 0 capsules remaining  ? ?Please contact further if needed ?

## 2022-05-30 ENCOUNTER — Encounter: Payer: Self-pay | Admitting: Obstetrics and Gynecology

## 2022-05-30 ENCOUNTER — Ambulatory Visit (INDEPENDENT_AMBULATORY_CARE_PROVIDER_SITE_OTHER): Payer: BC Managed Care – PPO | Admitting: Obstetrics and Gynecology

## 2022-05-30 DIAGNOSIS — Z3042 Encounter for surveillance of injectable contraceptive: Secondary | ICD-10-CM | POA: Diagnosis not present

## 2022-05-30 MED ORDER — MEDROXYPROGESTERONE ACETATE 150 MG/ML IM SUSP
150.0000 mg | Freq: Once | INTRAMUSCULAR | Status: AC
  Administered 2022-05-30: 150 mg via INTRAMUSCULAR

## 2022-05-30 NOTE — Progress Notes (Signed)
Date last pap: na. Last Depo-Provera: [id number]. Side Effects if any: . Serum HCG indicated? N/a. Depo-Provera 150 mg IM given by: Levert Feinstein. Next appointment due 08/25/2 08/29/2022.

## 2022-07-01 IMAGING — US US BREAST*L* LIMITED INC AXILLA
1 series · 7 of 7 positions shown · non-contrast
Comparison: Previous exam(s).

CLINICAL DATA: Callback for LEFT breast mass off of baseline
mammogram

EXAM:
DIGITAL DIAGNOSTIC UNILATERAL LEFT MAMMOGRAM WITH TOMOSYNTHESIS AND
CAD; ULTRASOUND LEFT BREAST LIMITED
TECHNIQUE: Left digital diagnostic mammography and breast tomosynthesis was
performed. The images were evaluated with computer-aided detection.;
Targeted ultrasound examination of the left breast was performed.

[Series 1: us breast*left* limited inc axilla · 0.06mm/px · 7 of 7 slices shown]
[im 1/7]
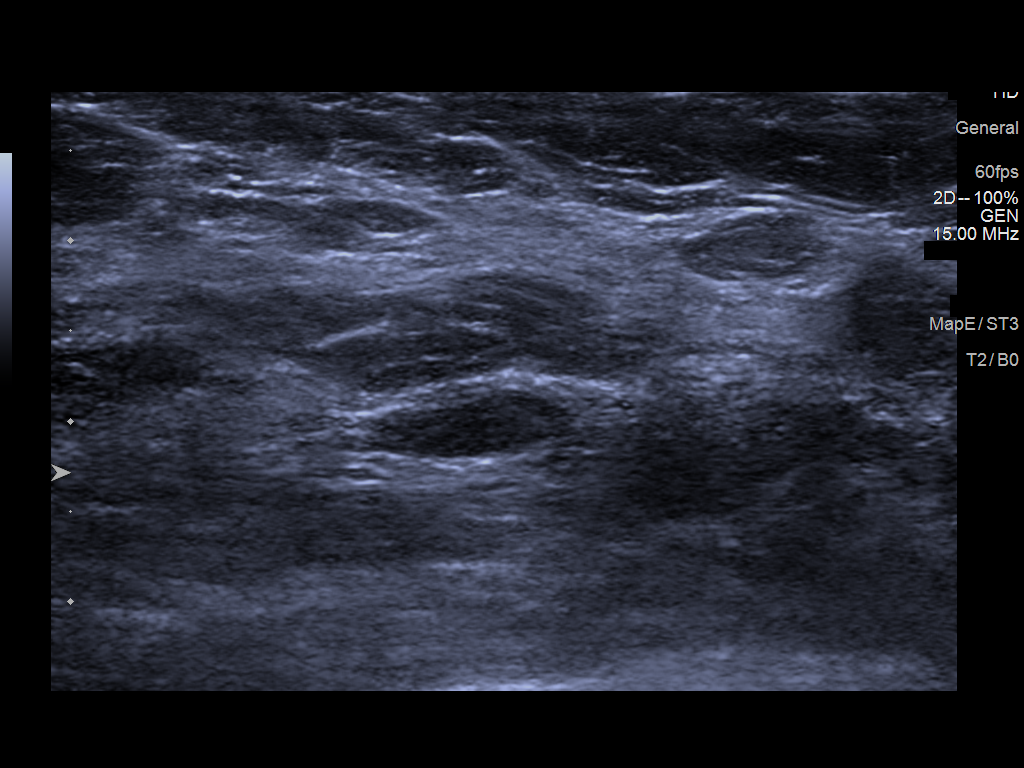
[im 2/7]
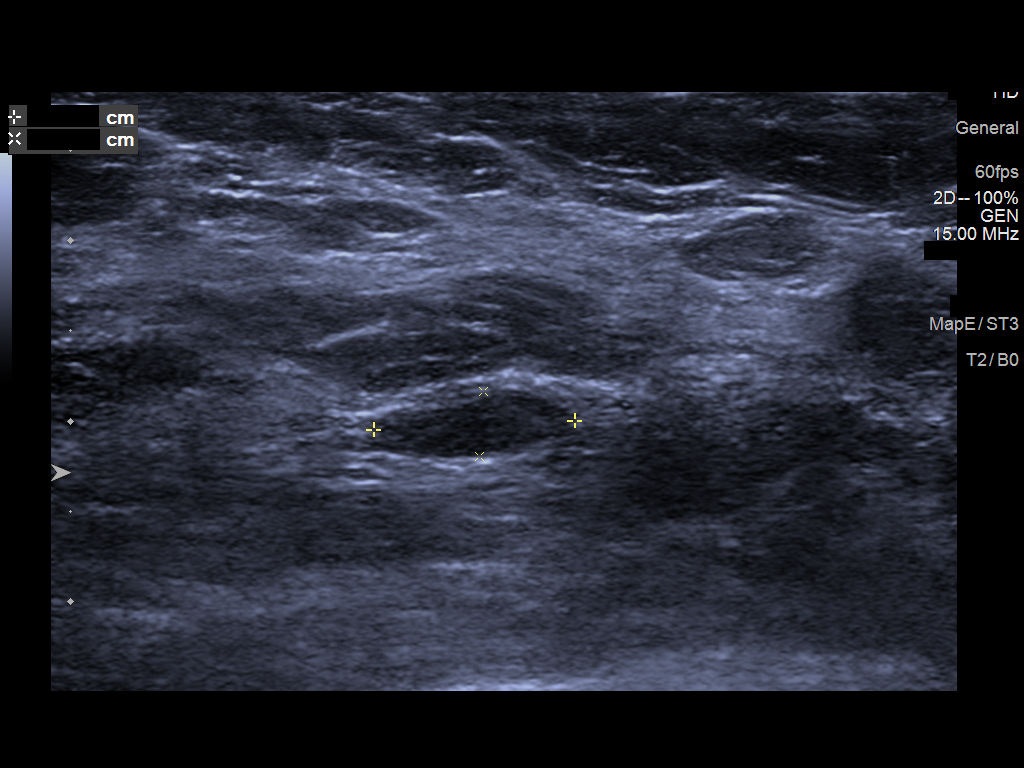
[im 3/7]
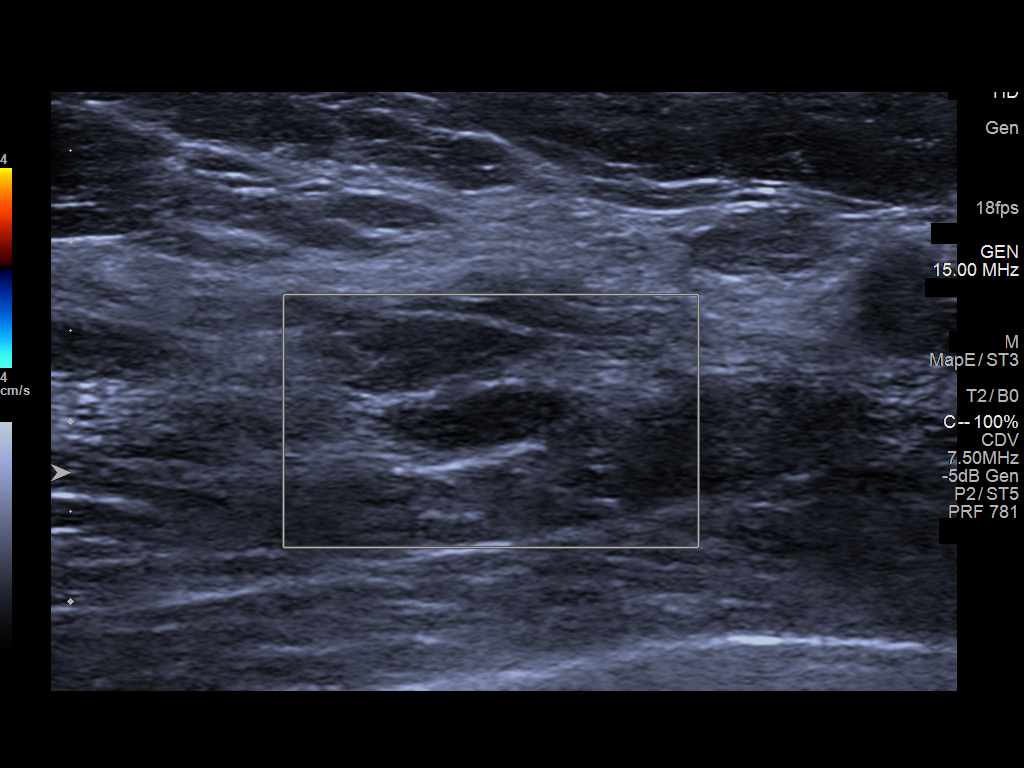
[im 4/7]
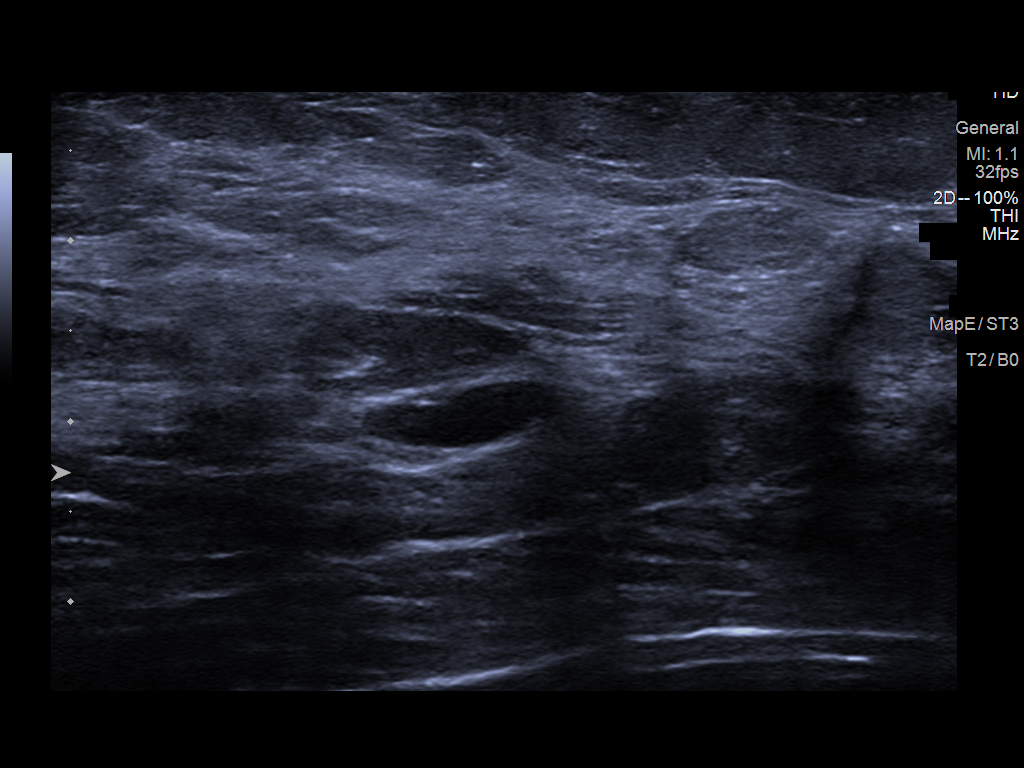
[im 5/7]
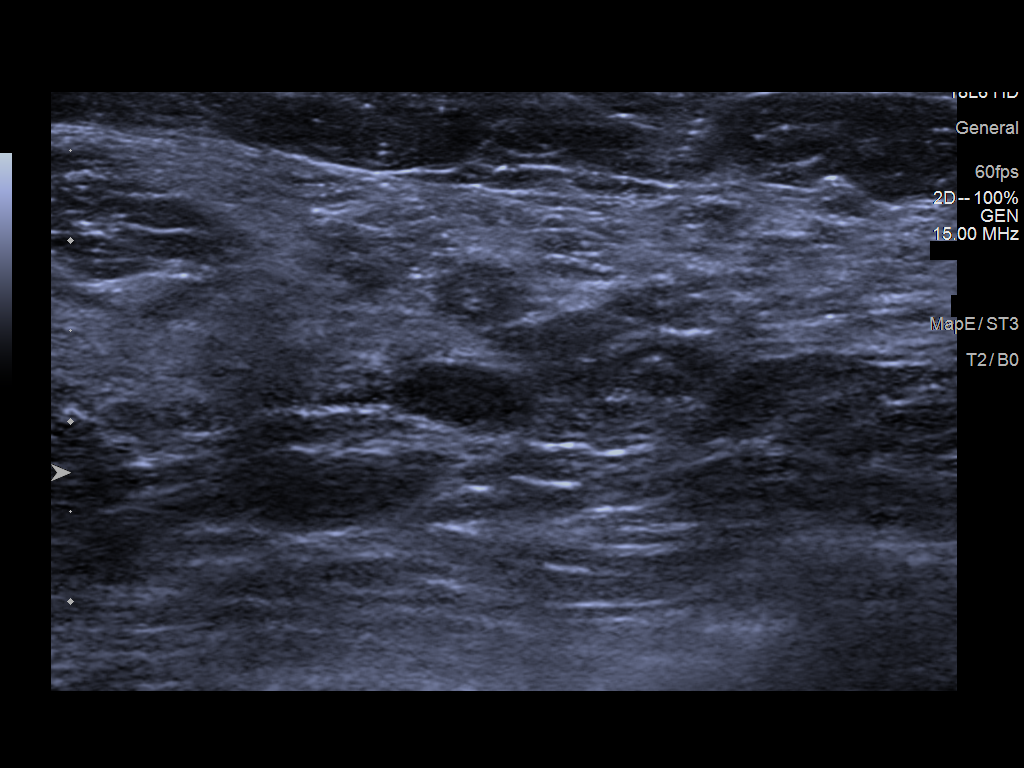
[im 6/7]
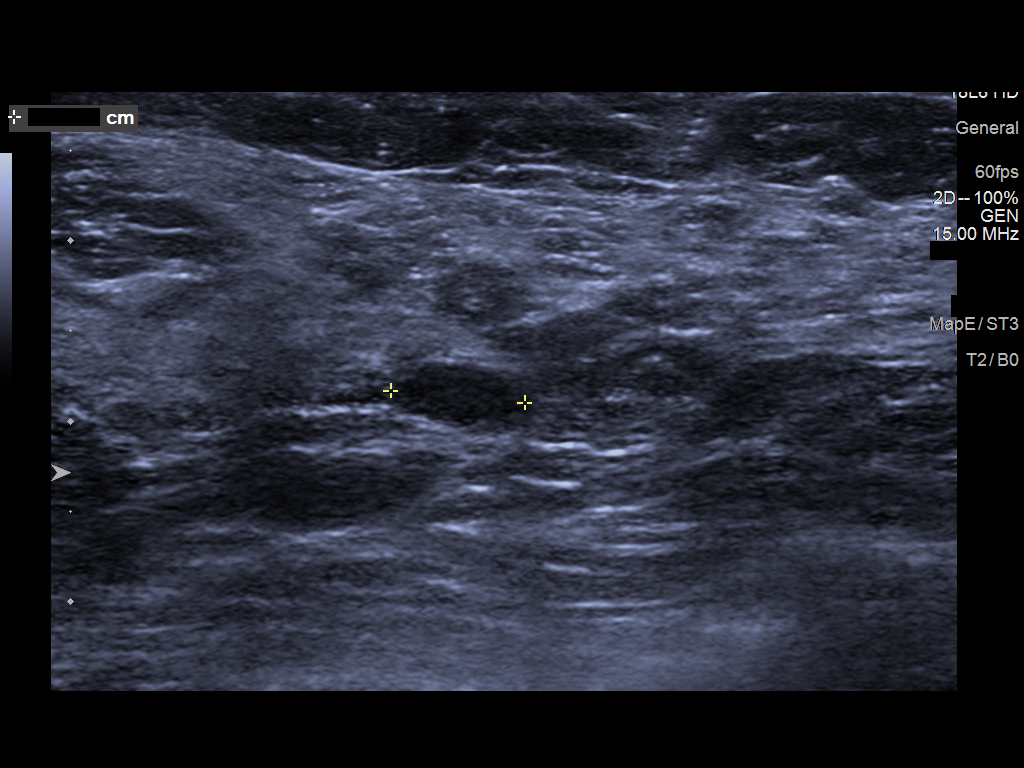
[im 7/7]
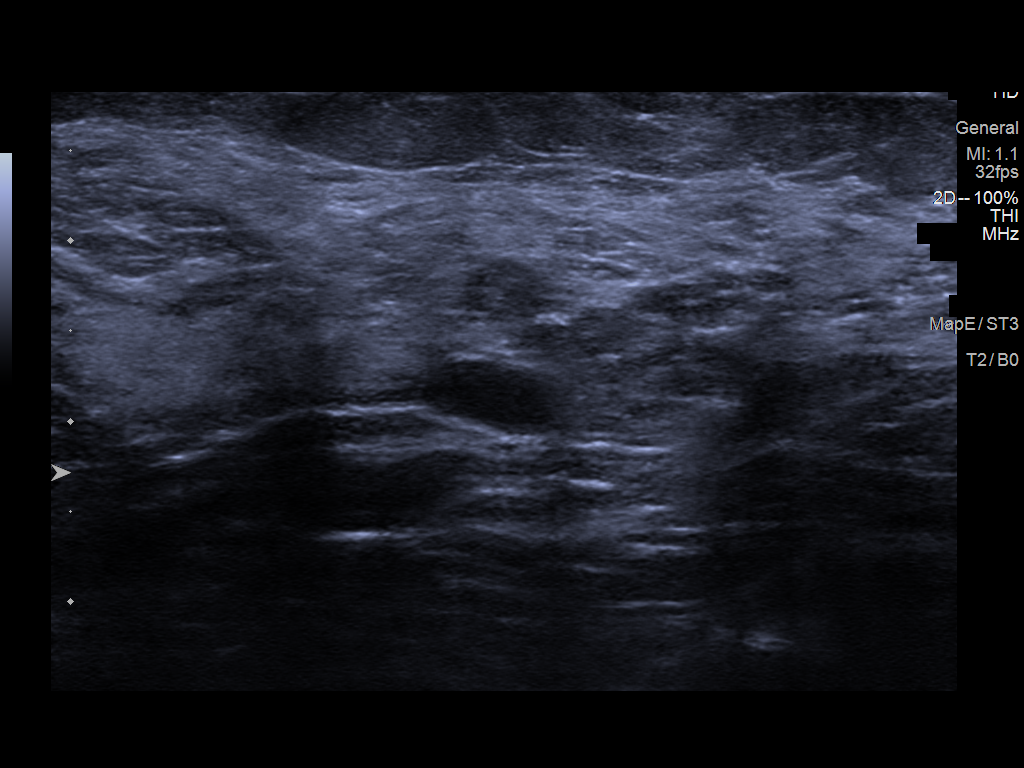

[7 of 7 positions shown; findings below may reference images not displayed]

ACR Breast Density Category c: The breast tissue is heterogeneously
dense, which may obscure small masses.
FINDINGS: Spot compression tomosynthesis views demonstrate a persistent
low-density oval circumscribed mass in the LEFT outer breast at
posterior depth.

On physical exam, no suspicious mass is appreciated.

Targeted ultrasound was performed of the LEFT outer breast. At 3
o'clock 6 cm from the nipple, there is an oval circumscribed
hypoechoic mass with posterior acoustic enhancement. It measures 8 x
11 x 4 mm. This is favored to correspond to the site of screening
mammographic concern.
IMPRESSION: There is a probably benign mass in the LEFT outer breast which
likely reflects a benign fibroadenoma or complicated cyst. Recommend
follow-up mammogram and ultrasound in 6 months. This will establish
6 months of definitive stability from this baseline mammogram.

RECOMMENDATION:
LEFT breast diagnostic mammogram and ultrasound in 6 months.

I have discussed the findings and recommendations with the patient.
If applicable, a reminder letter will be sent to the patient
regarding the next appointment.

BI-RADS CATEGORY  3: Probably benign.

## 2022-07-01 IMAGING — MG MM DIGITAL DIAGNOSTIC UNILAT*L* W/ TOMO W/ CAD
4 series · 4 of 12 positions shown · non-contrast
Comparison: Previous exam(s).

CLINICAL DATA: Callback for LEFT breast mass off of baseline
mammogram

EXAM:
DIGITAL DIAGNOSTIC UNILATERAL LEFT MAMMOGRAM WITH TOMOSYNTHESIS AND
CAD; ULTRASOUND LEFT BREAST LIMITED
TECHNIQUE: Left digital diagnostic mammography and breast tomosynthesis was
performed. The images were evaluated with computer-aided detection.;
Targeted ultrasound examination of the left breast was performed.

[L CC synth-2D]
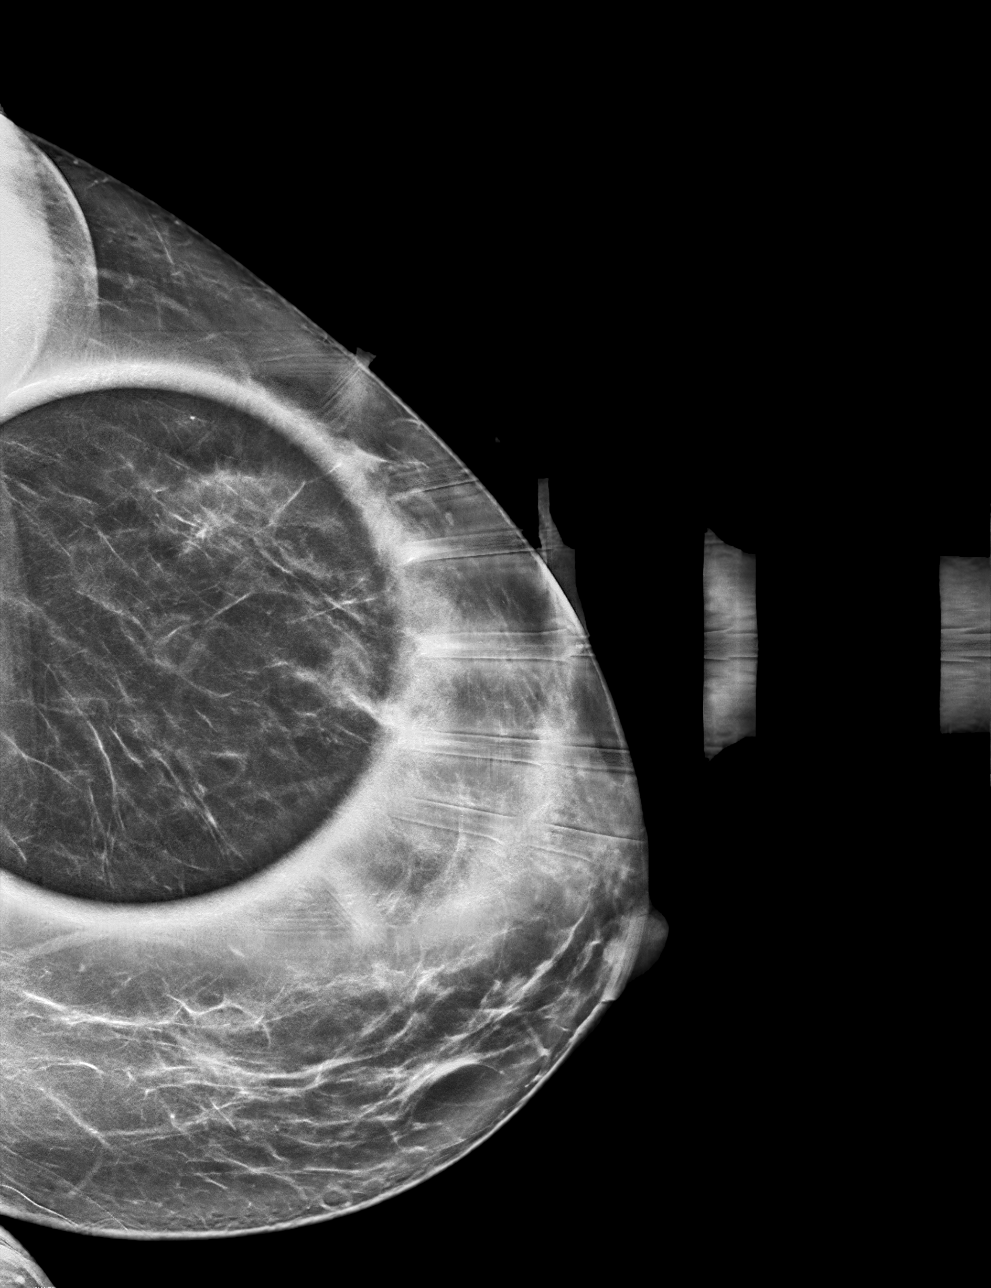

[L MLO synth-2D]
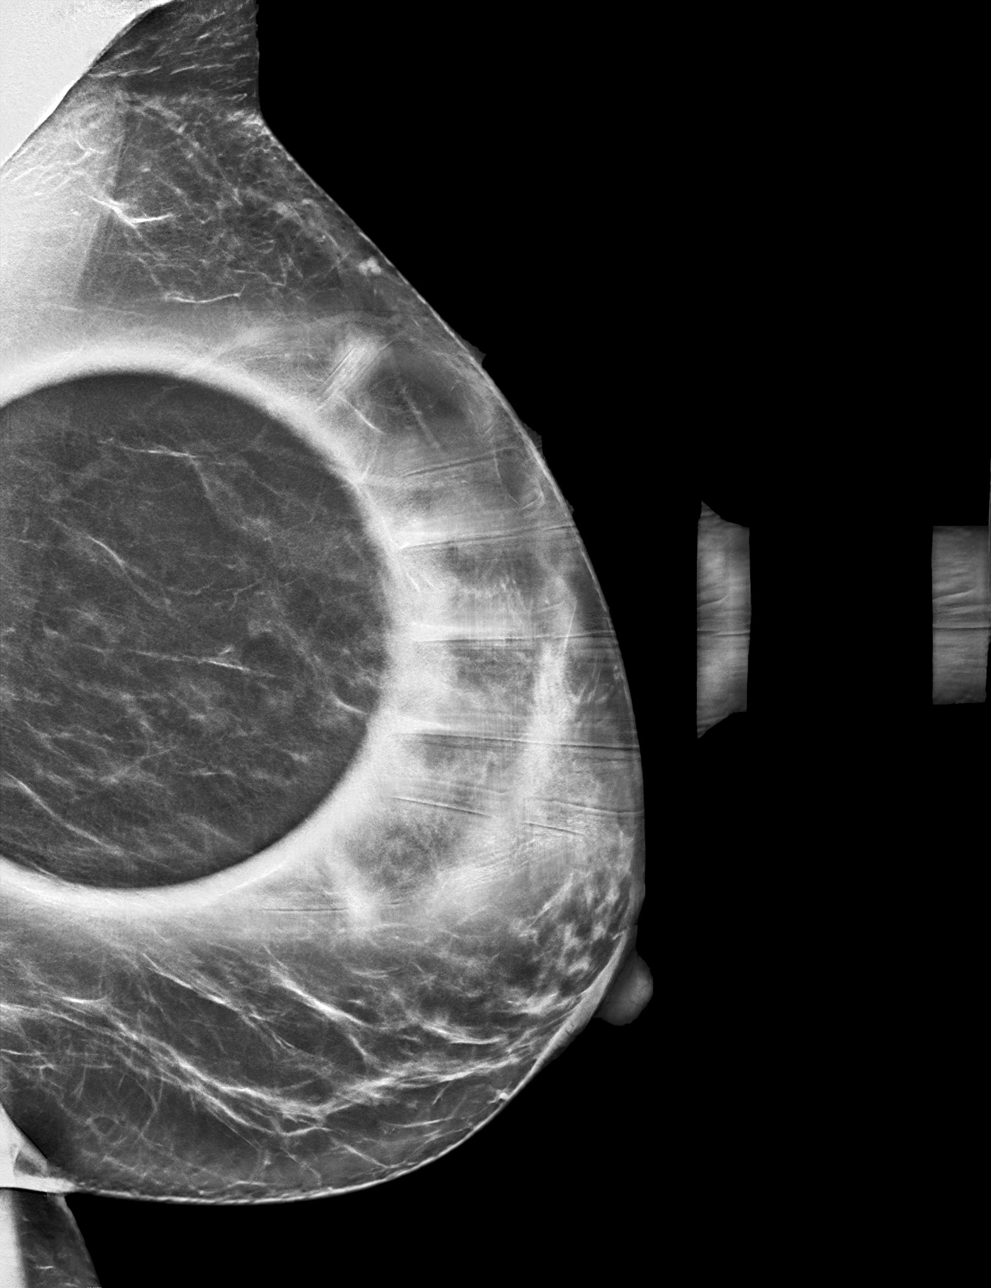

[L MLO tomo · tomo slice 35/70.0]
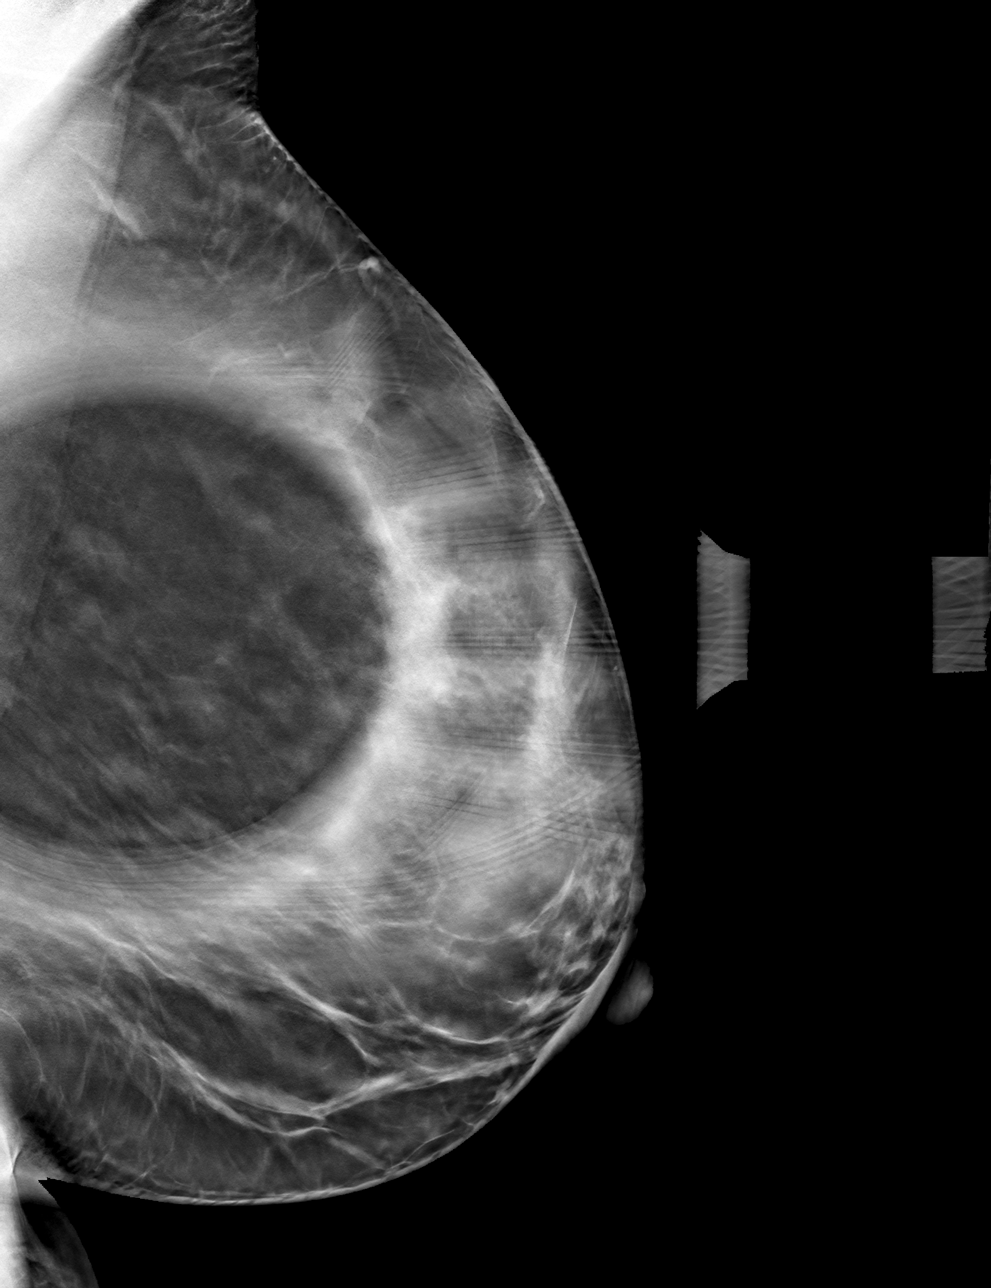

[L CC tomo · tomo slice 37/72.0]
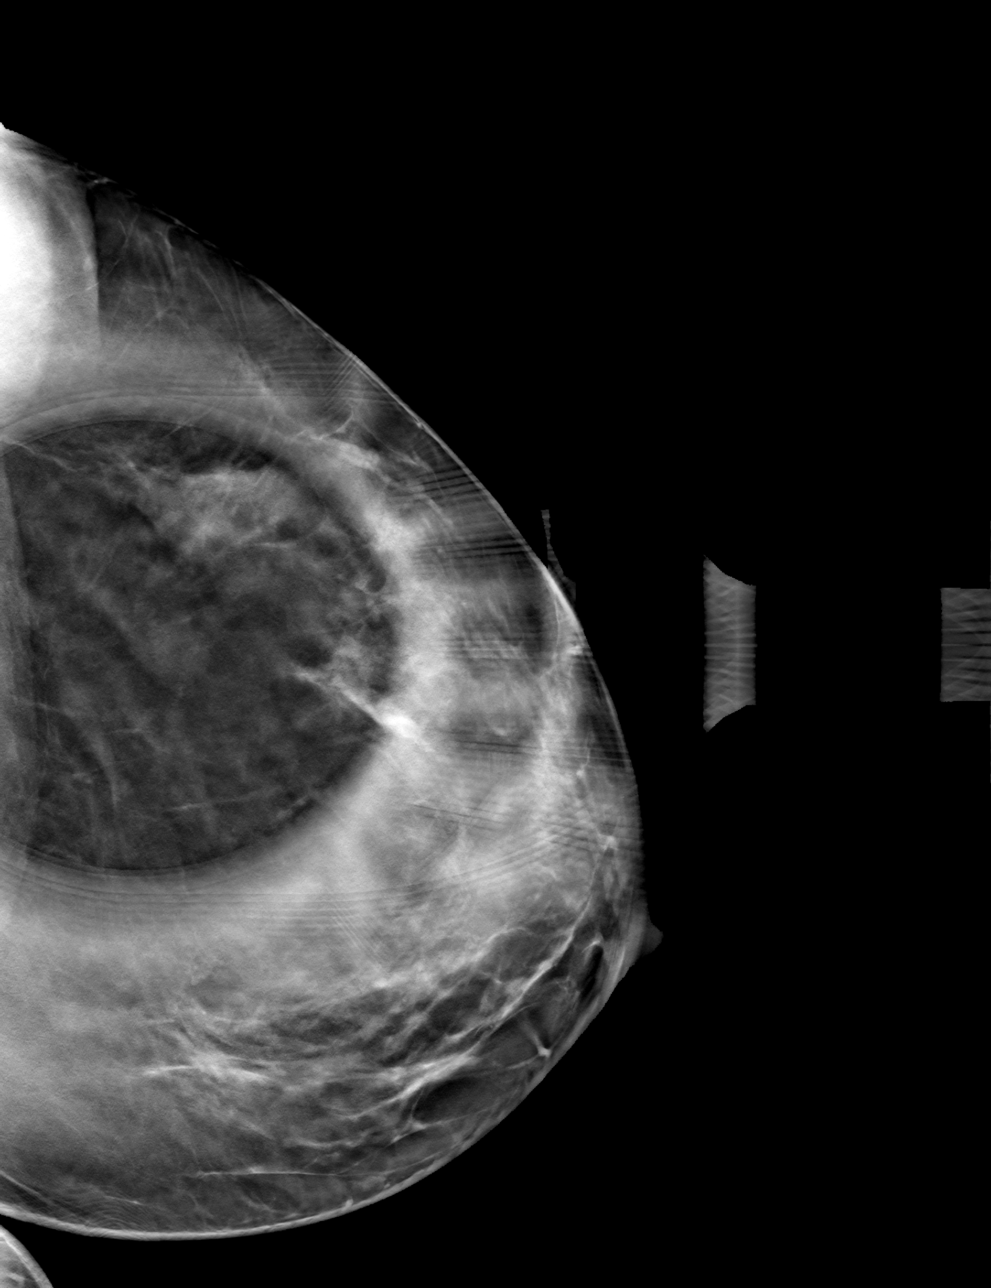

[4 of 12 positions shown; findings below may reference images not displayed]

ACR Breast Density Category c: The breast tissue is heterogeneously
dense, which may obscure small masses.
FINDINGS: Spot compression tomosynthesis views demonstrate a persistent
low-density oval circumscribed mass in the LEFT outer breast at
posterior depth.

On physical exam, no suspicious mass is appreciated.

Targeted ultrasound was performed of the LEFT outer breast. At 3
o'clock 6 cm from the nipple, there is an oval circumscribed
hypoechoic mass with posterior acoustic enhancement. It measures 8 x
11 x 4 mm. This is favored to correspond to the site of screening
mammographic concern.
IMPRESSION: There is a probably benign mass in the LEFT outer breast which
likely reflects a benign fibroadenoma or complicated cyst. Recommend
follow-up mammogram and ultrasound in 6 months. This will establish
6 months of definitive stability from this baseline mammogram.

RECOMMENDATION:
LEFT breast diagnostic mammogram and ultrasound in 6 months.

I have discussed the findings and recommendations with the patient.
If applicable, a reminder letter will be sent to the patient
regarding the next appointment.

BI-RADS CATEGORY  3: Probably benign.

## 2022-07-08 ENCOUNTER — Other Ambulatory Visit: Payer: Self-pay

## 2022-07-08 DIAGNOSIS — I10 Essential (primary) hypertension: Secondary | ICD-10-CM

## 2022-07-08 MED ORDER — AMLODIPINE BESY-BENAZEPRIL HCL 5-10 MG PO CAPS: 1.0000 | ORAL_CAPSULE | Freq: Every day | ORAL | 0 refills | Status: DC

## 2022-07-15 ENCOUNTER — Encounter: Payer: Self-pay | Admitting: Family Medicine

## 2022-07-15 ENCOUNTER — Ambulatory Visit: Payer: BC Managed Care – PPO | Admitting: Family Medicine

## 2022-07-15 VITALS — BP 124/86 | HR 87 | Ht 62.0 in | Wt 147.0 lb

## 2022-07-15 DIAGNOSIS — I10 Essential (primary) hypertension: Secondary | ICD-10-CM | POA: Diagnosis not present

## 2022-07-15 DIAGNOSIS — G5623 Lesion of ulnar nerve, bilateral upper limbs: Secondary | ICD-10-CM | POA: Diagnosis not present

## 2022-07-15 MED ORDER — AMLODIPINE BESY-BENAZEPRIL HCL 5-10 MG PO CAPS: 1.0000 | ORAL_CAPSULE | Freq: Every day | ORAL | 0 refills | Status: DC

## 2022-07-15 NOTE — Assessment & Plan Note (Signed)
Well-controlled on current regimen, mild increase from the prior recorded value can be thought secondary to recent dietary/lifestyle indulgences (went on a cruise).  I have encouraged her to restart her prior healthy behaviors as she has had excellent success with them, continue current dose of medication, return in 3 months for annual physical where risk stratification labs will be repeated.

## 2022-07-15 NOTE — Progress Notes (Signed)
     Primary Care / Sports Medicine Office Visit  Patient Information:  Patient ID: Anita Miller, female DOB: December 23, 1975 Age: 46 y.o. MRN: [id number]   Anita Miller is a pleasant 46 y.o. female presenting with the following:  Chief Complaint  Patient presents with   Hypertension    Vitals:   07/15/22 1059  BP: 124/86  Pulse: 87  SpO2: 98%   Vitals:   07/15/22 1059  Weight: 147 lb (66.7 kg)  Height: 5\' 2"  (1.575 m)   Body mass index is 26.89 kg/m.  No results found.   Independent interpretation of notes and tests performed by another provider:   None  Procedures performed:   None  Pertinent History, Exam, Impression, and Recommendations:   Problem List Items Addressed This Visit       Cardiovascular and Mediastinum   Essential (primary) hypertension - Primary    Well-controlled on current regimen, mild increase from the prior recorded value can be thought secondary to recent dietary/lifestyle indulgences (went on a cruise).  I have encouraged her to restart her prior healthy behaviors as she has had excellent success with them, continue current dose of medication, return in 3 months for annual physical where risk stratification labs will be repeated.      Relevant Medications   amLODipine-benazepril (LOTREL) 5-10 MG capsule     Nervous and Auditory   Cubital tunnel syndrome, bilateral    Patient presenting with bilateral hand/wrist symptoms, left greater than right, ongoing for several months to a year involving paresthesias primarily in her fourth and fifth digits, noted when using her phone describing a bent elbow position.  Examination reveals no baseline sensorimotor deficit bilaterally in the hands, there is positive Tinel left greater than right at the cubital tunnel, negative Phalen's and Tinel's at the medial nerve, no atrophy noted, nontender throughout the elbow and wrist otherwise.  Her findings are most consistent with cubital tunnel syndrome,  left greater than right, discussion about activity modification to limit symptoms to place, additionally home exercises were provided.  If persistently symptomatic she may benefit from elbow splint and/or medications in addition to formal PT.       I provided a total time of 33 minutes including both face-to-face and non-face-to-face time on 07/15/2022 inclusive of time utilized for medical chart review, information gathering, care coordination with staff, and documentation completion.   Orders & Medications Meds ordered this encounter  Medications   amLODipine-benazepril (LOTREL) 5-10 MG capsule    Sig: Take 1 capsule by mouth daily.    Dispense:  90 capsule    Refill:  0   No orders of the defined types were placed in this encounter.    Return in about 3 months (around 10/15/2022) for Annual Physical.     10/17/2022, MD   Primary Care Sports Medicine [facility]

## 2022-07-15 NOTE — Assessment & Plan Note (Signed)
Patient presenting with bilateral hand/wrist symptoms, left greater than right, ongoing for several months to a year involving paresthesias primarily in her fourth and fifth digits, noted when using her phone describing a bent elbow position.  Examination reveals no baseline sensorimotor deficit bilaterally in the hands, there is positive Tinel left greater than right at the cubital tunnel, negative Phalen's and Tinel's at the medial nerve, no atrophy noted, nontender throughout the elbow and wrist otherwise.  Her findings are most consistent with cubital tunnel syndrome, left greater than right, discussion about activity modification to limit symptoms to place, additionally home exercises were provided.  If persistently symptomatic she may benefit from elbow splint and/or medications in addition to formal PT.

## 2022-07-15 NOTE — Patient Instructions (Signed)
-   Return to your prior healthy lifestyle changes (diet/exercise) - Continue current dose of blood pressure medication - Can start home exercises from the following website for your wrist/hand symptoms: [email or web address] -Return for annual physical in 3 months

## 2022-08-10 ENCOUNTER — Other Ambulatory Visit: Payer: Self-pay | Admitting: Family Medicine

## 2022-08-10 DIAGNOSIS — I10 Essential (primary) hypertension: Secondary | ICD-10-CM

## 2022-08-12 NOTE — Telephone Encounter (Signed)
Rx 07/15/22 #90- too soon Requested Prescriptions  Pending Prescriptions Disp Refills  . amLODipine-benazepril (LOTREL) 5-10 MG capsule [Pharmacy Med Name: AMLODIPINE-BENAZ 5/10MG  CAPSULES] 90 capsule 0    Sig: TAKE 1 CAPSULE BY MOUTH DAILY     Cardiovascular: CCB + ACEI Combos Failed - 08/10/2022  3:14 PM      Failed - Cr in normal range and within 180 days    Creatinine, Ser  Date Value Ref Range Status  08/30/2021 0.94 0.57 - 1.00 mg/dL Final         Failed - K in normal range and within 180 days    Potassium  Date Value Ref Range Status  08/30/2021 4.0 3.5 - 5.2 mmol/L Final         Failed - Na in normal range and within 180 days    Sodium  Date Value Ref Range Status  08/30/2021 139 134 - 144 mmol/L Final         Failed - eGFR is 30 or above and within 180 days    GFR calc Af Amer  Date Value Ref Range Status  05/08/2020 85 >59 mL/min/1.73 Final    Comment:    **[facility] currently reports eGFR in compliance with the current**   recommendations of the [employer]. [facility] will   update reporting as new guidelines are published from the NKF-ASN   Task force.    GFR calc non Af Amer  Date Value Ref Range Status  05/08/2020 74 >59 mL/min/1.73 Final   eGFR  Date Value Ref Range Status  08/30/2021 77 >59 mL/min/1.73 Final         Passed - Patient is not pregnant      Passed - Last BP in normal range    BP Readings from Last 1 Encounters:  07/15/22 124/86         Passed - Valid encounter within last 6 months    Recent Outpatient Visits          4 weeks ago Essential (primary) hypertension   [facility] [place], Earley Abide, MD   7 months ago Essential (primary) hypertension   [facility] [place], Earley Abide, MD   8 months ago Essential (primary) hypertension   [facility] at [facility], Earley Abide, MD    11 months ago Encounter for screening mammogram for malignant neoplasm of breast   [facility] at [facility], Earley Abide, MD   1 year ago Pharyngitis, unspecified etiology   [facility] at [facility], Earley Abide, MD      Future Appointments            In 2 months Zigmund Daniel, Earley Abide, MD [place] [facility] at [facility], [facility]

## 2022-09-22 ENCOUNTER — Ambulatory Visit: Payer: BC Managed Care – PPO

## 2022-09-22 ENCOUNTER — Ambulatory Visit (INDEPENDENT_AMBULATORY_CARE_PROVIDER_SITE_OTHER): Payer: BC Managed Care – PPO

## 2022-09-22 VITALS — BP 123/88 | HR 87 | Wt 154.0 lb

## 2022-09-22 DIAGNOSIS — Z3042 Encounter for surveillance of injectable contraceptive: Secondary | ICD-10-CM

## 2022-09-22 DIAGNOSIS — Z3202 Encounter for pregnancy test, result negative: Secondary | ICD-10-CM | POA: Diagnosis not present

## 2022-09-22 LAB — POCT URINE PREGNANCY: Preg Test, Ur: NEGATIVE

## 2022-09-22 MED ORDER — MEDROXYPROGESTERONE ACETATE 150 MG/ML IM SUSP
150.0000 mg | Freq: Once | INTRAMUSCULAR | Status: AC
  Administered 2022-09-22: 150 mg via INTRAMUSCULAR

## 2022-09-22 NOTE — Progress Notes (Signed)
Date last pap: na. Last Depo-Provera: [id number]. Side Effects if any: . Serum HCG indicated? N/a. Depo-Provera 150 mg IM given by: Otelia Limes, CMA Next appointment due December 18- January 1

## 2022-09-23 NOTE — Addendum Note (Signed)
Addended by: Augusto Gamble on: 09/23/2022 08:13 PM   Modules accepted: Level of Service

## 2022-10-15 ENCOUNTER — Encounter: Payer: Self-pay | Admitting: Family Medicine

## 2022-10-15 ENCOUNTER — Ambulatory Visit (INDEPENDENT_AMBULATORY_CARE_PROVIDER_SITE_OTHER): Payer: BC Managed Care – PPO | Admitting: Family Medicine

## 2022-10-15 VITALS — BP 118/80 | HR 88 | Ht 62.0 in | Wt 151.0 lb

## 2022-10-15 DIAGNOSIS — R569 Unspecified convulsions: Secondary | ICD-10-CM

## 2022-10-15 DIAGNOSIS — E785 Hyperlipidemia, unspecified: Secondary | ICD-10-CM | POA: Diagnosis not present

## 2022-10-15 DIAGNOSIS — G40909 Epilepsy, unspecified, not intractable, without status epilepticus: Secondary | ICD-10-CM

## 2022-10-15 DIAGNOSIS — I10 Essential (primary) hypertension: Secondary | ICD-10-CM | POA: Diagnosis not present

## 2022-10-15 DIAGNOSIS — R7989 Other specified abnormal findings of blood chemistry: Secondary | ICD-10-CM

## 2022-10-15 DIAGNOSIS — Z0000 Encounter for general adult medical examination without abnormal findings: Secondary | ICD-10-CM | POA: Diagnosis not present

## 2022-10-15 MED ORDER — AMLODIPINE BESY-BENAZEPRIL HCL 5-10 MG PO CAPS: 1.0000 | ORAL_CAPSULE | Freq: Every day | ORAL | 1 refills | Status: DC

## 2022-10-15 NOTE — Assessment & Plan Note (Signed)
Chronic, controlled, being followed by outside neurology group, and stable on Lamictal 100 mg daily regimen.  Due to patient's current home location, requesting referral to new neurologist in the [place] system, referral generated today.  Patient does have plans to touch base with her prior neurologist to coordinate further details, records, etc.

## 2022-10-15 NOTE — Assessment & Plan Note (Signed)
Annual examination completed, risk stratification labs ordered, anticipatory guidance provided.  We will follow labs once resulted.  Does have plans to follow-up with her gynecologist for cervical cancer screening.

## 2022-10-15 NOTE — Patient Instructions (Signed)
-   Obtain fasting labs with orders provided (can have water or black coffee but otherwise no food or drink x 8 hours before labs) - Review information provided - Attend eye doctor annually, dentist every 6 months, work towards or maintain 30 minutes of moderate intensity physical activity at least 5 days per week, and consume a balanced diet - Return in 6 months for BP follow-up - Contact us for any questions between now and then

## 2022-10-15 NOTE — Assessment & Plan Note (Signed)
Chronic, controlled, denies any cardiopulmonary complaints.  She is doing very well on current amlodipine-benazepril regimen.  Updated risk stratification labs ordered.

## 2022-10-15 NOTE — Progress Notes (Signed)
Annual Physical Exam Visit  Patient Information:  Patient ID: Anita Miller, female DOB: Jun 29, 1976 Age: 46 y.o. MRN: [id number]   Subjective:   CC: Annual Physical Exam  HPI:  Anita Miller is here for their annual physical.  I reviewed the past medical history, family history, social history, surgical history, and allergies today and changes were made as necessary.  Please see the problem list section below for additional details.  Past Medical History: Past Medical History:  Diagnosis Date   Epilepsy ([place])    Hyperlipidemia    Hypertension    Past Surgical History: Past Surgical History:  Procedure Laterality Date   COSMETIC SURGERY  08/01/2020   WISDOM TOOTH EXTRACTION Bilateral 2012   Family History: Family History  Problem Relation Age of Onset   Healthy Mother    Healthy Father    Asthma Daughter    Seizures Son    Breast cancer Neg Hx    Allergies: No Known Allergies Health Maintenance: Health Maintenance  Topic Date Due   PAP SMEAR-Modifier  06/07/2022   INFLUENZA VACCINE  03/22/2023 (Originally 07/22/2022)   COVID-19 Vaccine (1) 10/31/2026 (Originally 07/17/1976)   Fecal DNA (Cologuard)  02/04/2025   TETANUS/TDAP  08/31/2031   Hepatitis C Screening  Completed   HIV Screening  Completed   HPV VACCINES  Aged Out    HM Colonoscopy     This patient has no relevant Health Maintenance data.      Medications: Current Outpatient Medications on File Prior to Visit  Medication Sig Dispense Refill   Bacillus Coagulans-Inulin (ALIGN PREBIOTIC-PROBIOTIC) 5-1.25 MG-GM CHEW Chew by mouth daily. Currently taking Olly Probiotic     lamoTRIgine (LAMICTAL) 100 MG tablet Take 100 mg by mouth daily.     medroxyPROGESTERone (DEPO-PROVERA) 150 MG/ML injection Inject 1 mL (150 mg total) into the muscle every 3 (three) months. 1 mL 3   Multiple Vitamin (MULTIVITAMIN ADULT PO) Take 1 tablet by mouth daily. Hair, skin and nails formula     Current  Facility-Administered Medications on File Prior to Visit  Medication Dose Route Frequency Provider Last Rate Last Admin   medroxyPROGESTERone (DEPO-PROVERA) injection 150 mg  150 mg Intramuscular Q90 days Rubie Maid, MD   150 mg at 03/14/22 4081    Review of Systems: No headache, visual changes, nausea, vomiting, diarrhea, constipation, dizziness, abdominal pain, skin rash, fevers, chills, night sweats, swollen lymph nodes, weight loss, chest pain, body aches, joint swelling, muscle aches, shortness of breath, mood changes, visual or auditory hallucinations reported.  Objective:   Vitals:   10/15/22 0908  BP: 118/80  Pulse: 88  SpO2: 99%   Vitals:   10/15/22 0908  Weight: 151 lb (68.5 kg)  Height: 5\' 2"  (1.575 m)   Body mass index is 27.62 kg/m.  General: Well Developed, well nourished, and in no acute distress.  Neuro: Alert and oriented x3, extra-ocular muscles intact, sensation grossly intact. Cranial nerves II through XII are grossly intact, motor, sensory, and coordinative functions are intact. HEENT: Normocephalic, atraumatic, pupils equal round reactive to light, neck supple, no masses, no lymphadenopathy, thyroid nonpalpable. Oropharynx, nasopharynx, external ear canals are unremarkable. Skin: Warm and dry, no rashes noted.  Cardiac: Regular rate and rhythm, no murmurs rubs or gallops. No peripheral edema. Pulses symmetric. Respiratory: Clear to auscultation bilaterally. Not using accessory muscles, speaking in full sentences.  Abdominal: Soft, nontender, nondistended, positive bowel sounds, no masses, no organomegaly. Musculoskeletal: Shoulder, elbow, wrist, hip, knee, ankle stable, and with full  range of motion.  Female chaperone initials: KG present throughout the physical examination.  Impression and Recommendations:   The patient was counselled, risk factors were discussed, and anticipatory guidance given.  Problem List Items Addressed This Visit        Cardiovascular and Mediastinum   Essential (primary) hypertension    Chronic, controlled, denies any cardiopulmonary complaints.  She is doing very well on current amlodipine-benazepril regimen.  Updated risk stratification labs ordered.      Relevant Medications   amLODipine-benazepril (LOTREL) 5-10 MG capsule   Other Relevant Orders   Comprehensive metabolic panel   CBC   Lipid panel   TSH     Nervous and Auditory   Epilepsy (HCC)   Relevant Orders   Ambulatory referral to Neurology     Other   Seizure [facility])    Chronic, controlled, being followed by outside neurology group, and stable on Lamictal 100 mg daily regimen.  Due to patient's current home location, requesting referral to new neurologist in the [facility] system, referral generated today.  Patient does have plans to touch base with her prior neurologist to coordinate further details, records, etc.      Relevant Orders   Ambulatory referral to Neurology   Annual physical exam - Primary    Annual examination completed, risk stratification labs ordered, anticipatory guidance provided.  We will follow labs once resulted.  Does have plans to follow-up with her gynecologist for cervical cancer screening.      Relevant Orders   Comprehensive metabolic panel   CBC   Lipid panel   TSH   VITAMIN D 25 Hydroxy (Vit-D Deficiency, Fractures)   Hyperlipidemia    Lifestyle interventions stressed, patient is currently active in a regular high intense interval training program, repeat labs ordered.      Relevant Medications   amLODipine-benazepril (LOTREL) 5-10 MG capsule   Other Relevant Orders   Comprehensive metabolic panel   Lipid panel   Other Visit Diagnoses     Low serum vitamin D       Relevant Orders   VITAMIN D 25 Hydroxy (Vit-D Deficiency, Fractures)        Orders & Medications Medications:  Meds ordered this encounter  Medications   amLODipine-benazepril (LOTREL) 5-10 MG capsule    Sig: Take 1 capsule by  mouth daily.    Dispense:  90 capsule    Refill:  1   Orders Placed This Encounter  Procedures   Comprehensive metabolic panel   CBC   Lipid panel   TSH   VITAMIN D 25 Hydroxy (Vit-D Deficiency, Fractures)   Ambulatory referral to Neurology     No follow-ups on file.    Jerrol Banana, MD   Primary Care Sports Medicine [facility]

## 2022-10-15 NOTE — Assessment & Plan Note (Signed)
Lifestyle interventions stressed, patient is currently active in a regular high intense interval training program, repeat labs ordered.

## 2022-10-16 ENCOUNTER — Other Ambulatory Visit: Payer: Self-pay | Admitting: Family Medicine

## 2022-10-16 LAB — COMPREHENSIVE METABOLIC PANEL
ALT: 17 IU/L (ref 0–32)
AST: 24 IU/L (ref 0–40)
Albumin/Globulin Ratio: 2 (ref 1.2–2.2)
Albumin: 4.5 g/dL (ref 3.9–4.9)
Alkaline Phosphatase: 91 IU/L (ref 44–121)
BUN/Creatinine Ratio: 10 (ref 9–23)
BUN: 9 mg/dL (ref 6–24)
Bilirubin Total: 0.4 mg/dL (ref 0.0–1.2)
CO2: 21 mmol/L (ref 20–29)
Calcium: 9.6 mg/dL (ref 8.7–10.2)
Chloride: 103 mmol/L (ref 96–106)
Creatinine, Ser: 0.92 mg/dL (ref 0.57–1.00)
Globulin, Total: 2.3 g/dL (ref 1.5–4.5)
Glucose: 100 mg/dL — ABNORMAL HIGH (ref 70–99)
Potassium: 4.2 mmol/L (ref 3.5–5.2)
Sodium: 139 mmol/L (ref 134–144)
Total Protein: 6.8 g/dL (ref 6.0–8.5)
eGFR: 78 mL/min/{1.73_m2} (ref >59)

## 2022-10-16 LAB — LIPID PANEL
Chol/HDL Ratio: 4.9 ratio — ABNORMAL HIGH (ref 0.0–4.4)
Cholesterol, Total: 200 mg/dL — ABNORMAL HIGH (ref 100–199)
HDL: 41 mg/dL (ref >39)
LDL Chol Calc (NIH): 144 mg/dL — ABNORMAL HIGH (ref 0–99)
Triglycerides: 84 mg/dL (ref 0–149)
VLDL Cholesterol Cal: 15 mg/dL (ref 5–40)

## 2022-10-16 LAB — VITAMIN D 25 HYDROXY (VIT D DEFICIENCY, FRACTURES): Vit D, 25-Hydroxy: 17.5 ng/mL — ABNORMAL LOW (ref 30.0–100.0)

## 2022-10-16 LAB — CBC
Hematocrit: 41.1 % (ref 34.0–46.6)
Hemoglobin: 14.4 g/dL (ref 11.1–15.9)
MCH: 31.4 pg (ref 26.6–33.0)
MCHC: 35 g/dL (ref 31.5–35.7)
MCV: 90 fL (ref 79–97)
Platelets: 374 10*3/uL (ref 150–450)
RBC: 4.58 x10E6/uL (ref 3.77–5.28)
RDW: 12.4 % (ref 11.7–15.4)
WBC: 4.8 10*3/uL (ref 3.4–10.8)

## 2022-10-16 LAB — TSH: TSH: 1.04 u[IU]/mL (ref 0.450–4.500)

## 2022-10-16 MED ORDER — VITAMIN D (ERGOCALCIFEROL) 1.25 MG (50000 UNIT) PO CAPS: 50000.0000 [IU] | ORAL_CAPSULE | ORAL | 0 refills | Status: DC

## 2022-10-16 NOTE — Progress Notes (Signed)
Lab added R73.01 

## 2022-10-17 LAB — SPECIMEN STATUS REPORT

## 2022-10-17 LAB — HGB A1C W/O EAG: Hgb A1c MFr Bld: 5.5 % (ref 4.8–5.6)

## 2022-12-04 ENCOUNTER — Other Ambulatory Visit: Payer: Self-pay | Admitting: Obstetrics and Gynecology

## 2022-12-11 ENCOUNTER — Ambulatory Visit (INDEPENDENT_AMBULATORY_CARE_PROVIDER_SITE_OTHER): Payer: BC Managed Care – PPO

## 2022-12-11 VITALS — BP 129/83 | HR 96 | Ht 62.0 in | Wt 154.0 lb

## 2022-12-11 DIAGNOSIS — Z3042 Encounter for surveillance of injectable contraceptive: Secondary | ICD-10-CM

## 2022-12-11 MED ORDER — MEDROXYPROGESTERONE ACETATE 150 MG/ML IM SUSP
150.0000 mg | Freq: Once | INTRAMUSCULAR | Status: AC
  Administered 2022-12-11: 150 mg via INTRAMUSCULAR

## 2022-12-11 NOTE — Progress Notes (Signed)
Date last pap: N/A. Last Depo-Provera: 09/22/2020. Side Effects if any: na . Serum HCG indicated? na. Depo-Provera 150 mg IM given by: Beverely Pace CMA . Next appointment due March 8-22.

## 2023-01-07 NOTE — Progress Notes (Signed)
GYNECOLOGY ANNUAL PHYSICAL EXAM PROGRESS NOTE  Subjective:    Anita Miller is a 47 y.o. G108P2002 female who presents for an annual exam.  The patient is sexually active. The patient participates in regular exercise: yes. Has the patient ever been transfused or tattooed?: yes. The patient reports that there is not domestic violence in her life.   The patient has the following complaints today: Desires a referral to a Dermatologist.  Notes that she has been dealing with hyperhidrosis since her teenage years. She has tried multiple over the counter as well as prescription medications without relief.  Would like to try Botox injections.  Would like a referral to a new PCP.  Notes that she likes her current PCP, but feels self-conscious during her exams and would prefer a female.   Menstrual History: Menarche age: 37 No LMP recorded (lmp unknown). Patient has had an injection.   Gynecologic History:  Contraception: Depo-Provera injections History of STI's: yes Last Pap: 06/08/2019. Results were: normal.  Denies h/o abnormal pap smears. Last mammogram: 01/06/2022. Results were: abnormal, BIRADS 0, possible mass in left breast. Diagnostic left mammogram noting possible fibroadenoma vs cyst, recommend repeat in 6 months.     Upstream - 01/09/23 0926       Pregnancy Intention Screening   Does the patient want to become pregnant in the next year? No    Does the patient's partner want to become pregnant in the next year? No    Would the patient like to discuss contraceptive options today? No      Contraception Wrap Up   Current Method Hormonal Injection    End Method Hormonal Injection    Contraception Counseling Provided No    How was the end contraceptive method provided? N/A            The pregnancy intention screening data noted above was reviewed. Potential methods of contraception were discussed. The patient elected to proceed with Hormonal Injection.    OB History   Gravida Para Term Preterm AB Living  2 2 2  0 0 2  SAB IAB Ectopic Multiple Live Births  0 0 0 0 2    # Outcome Date GA Lbr Len/2nd Weight Sex Delivery Anes PTL Lv  2 Term 02/14/06    M Vag-Spont   LIV  1 Term 09/30/00    F Vag-Spont   LIV    Past Medical History:  Diagnosis Date   Epilepsy (HCC)    Hyperlipidemia    Hypertension     Past Surgical History:  Procedure Laterality Date   COSMETIC SURGERY  08/01/2020   WISDOM TOOTH EXTRACTION Bilateral 2012    Family History  Problem Relation Age of Onset   Healthy Mother    Healthy Father    Asthma Daughter    Seizures Son    Breast cancer Neg Hx     Social History   Socioeconomic History   Marital status: Single    Spouse name: Not on file   Number of children: 2   Years of education: 18   Highest education level: Master's degree (e.g., MA, MS, MEng, MEd, MSW, MBA)  Occupational History   Occupation: [employer]  Tobacco Use   Smoking status: Never   Smokeless tobacco: Never  Vaping Use   Vaping Use: Never used  Substance and Sexual Activity   Alcohol use: Not Currently   Drug use: Yes    Types: Marijuana    Comment: occ.  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)   Social Determinants of Health   Financial Resource Strain: Low Risk  (07/15/2022)   Overall Financial Resource Strain (CARDIA)    Difficulty of Paying Living Expenses: Not hard at all  Food Insecurity: No Food Insecurity (07/15/2022)   Hunger Vital Sign    Worried About Running Out of Food in the Last Year: Never true    Ran Out of Food in the Last Year: Never true  Transportation Needs: No Transportation Needs (07/15/2022)   PRAPARE - Administrator, Civil Service (Medical): No    Lack of Transportation (Non-Medical): No  Physical Activity: Unknown (12/08/2018)   Exercise Vital  Sign    Days of Exercise per Week: Patient refused    Minutes of Exercise per Session: Patient refused  Stress: Unknown (12/08/2018)   [employer] of Occupational Health - Occupational Stress Questionnaire    Feeling of Stress : Patient refused  Social Connections: Unknown (12/08/2018)   Social Connection and Isolation Panel [NHANES]    Frequency of Communication with Friends and Family: Patient refused    Frequency of Social Gatherings with Friends and Family: Patient refused    Attends Religious Services: Patient refused    Active Member of Clubs or Organizations: Patient refused    Attends Banker Meetings: Patient refused    Marital Status: Patient refused  Intimate Partner Violence: Not At Risk (07/15/2022)   Humiliation, Afraid, Rape, and Kick questionnaire    Fear of Current or Ex-Partner: No    Emotionally Abused: No    Physically Abused: No    Sexually Abused: No    Current Outpatient Medications on File Prior to Visit  Medication Sig Dispense Refill   amLODipine-benazepril (LOTREL) 5-10 MG capsule Take 1 capsule by mouth daily. 90 capsule 1   Bacillus Coagulans-Inulin (ALIGN PREBIOTIC-PROBIOTIC) 5-1.25 MG-GM CHEW Chew by mouth daily. Currently taking Olly Probiotic     lamoTRIgine (LAMICTAL) 100 MG tablet Take 100 mg by mouth daily.     medroxyPROGESTERone (DEPO-PROVERA) 150 MG/ML injection Inject 1 mL (150 mg total) into the muscle every 3 (three) months. 1 mL 3   Multiple Vitamin (MULTIVITAMIN ADULT PO) Take 1 tablet by mouth daily. Hair, skin and nails formula     Vitamin D, Ergocalciferol, (DRISDOL) 1.25 MG (50000 UNIT) CAPS capsule Take 1 capsule (50,000 Units total) by mouth every 7 (seven) days. Take for 8 total doses(weeks) 8 capsule 0   Current Facility-Administered Medications on File Prior to Visit  Medication Dose Route Frequency Provider Last Rate Last Admin   medroxyPROGESTERone (DEPO-PROVERA) injection 150 mg  150 mg Intramuscular Q90 days  Hildred Laser, MD   150 mg at 03/14/22 0913    No Known Allergies   Review of Systems Constitutional: negative for chills, fatigue, fevers and sweats Eyes: negative for irritation, redness and visual disturbance Ears, nose, mouth, throat, and face: negative for hearing loss, nasal congestion, snoring and tinnitus Respiratory: negative for asthma, cough, sputum Cardiovascular: negative for chest pain, dyspnea, exertional chest pressure/discomfort, irregular heart beat, palpitations and syncope Gastrointestinal: negative for abdominal pain, change in bowel habits, nausea and vomiting Genitourinary: negative for abnormal menstrual periods, genital lesions, sexual problems and vaginal discharge, dysuria and urinary incontinence Integument/breast: negative for breast lump, breast tenderness and nipple discharge Hematologic/lymphatic: negative for bleeding  and easy bruising Musculoskeletal:negative for back pain and muscle weakness Neurological: negative for dizziness, headaches, vertigo and weakness Endocrine: negative for diabetic symptoms including polydipsia, polyuria and skin dryness Allergic/Immunologic: negative for hay fever and urticaria      Objective:  Blood pressure 116/83, pulse (!) 102, resp. rate 16, height 5\' 2"  (1.575 m), weight 151 lb 6.4 oz (68.7 kg). Body mass index is 27.69 kg/m.  General Appearance:    Alert, cooperative, no distress, appears stated age  Head:    Normocephalic, without obvious abnormality, atraumatic  Eyes:    PERRL, conjunctiva/corneas clear, EOM's intact, both eyes  Ears:    Normal external ear canals, both ears  Nose:   Nares normal, septum midline, mucosa normal, no drainage or sinus tenderness  Throat:   Lips, mucosa, and tongue normal; teeth and gums normal  Neck:   Supple, symmetrical, trachea midline, no adenopathy; thyroid: no enlargement/tenderness/nodules; no carotid bruit or JVD  Back:     Symmetric, no curvature, ROM normal, no CVA  tenderness  Lungs:     Clear to auscultation bilaterally, respirations unlabored  Chest Wall:    No tenderness or deformity   Heart:    Regular rate and rhythm, S1 and S2 normal, no murmur, rub or gallop  Breast Exam:    No tenderness, masses, or nipple abnormality  Abdomen:     Soft, non-tender, bowel sounds active all four quadrants, no masses, no organomegaly.    Genitalia:    Pelvic:external genitalia normal, vagina without lesions, discharge, or tenderness, rectovaginal septum  normal. Cervix normal in appearance, no cervical motion tenderness, no adnexal masses or tenderness.  Uterus normal size, shape, mobile, regular contours, nontender.  Rectal:    Normal external sphincter.  No hemorrhoids appreciated. Internal exam not done.   Extremities:   Extremities normal, atraumatic, no cyanosis or edema  Pulses:   2+ and symmetric all extremities  Skin:   Skin color, texture, turgor normal, no rashes or lesions  Lymph nodes:   Cervical, supraclavicular, and axillary nodes normal  Neurologic:   CNII-XII intact, normal strength, sensation and reflexes throughout   .  Labs:  Lab Results  Component Value Date   WBC 4.8 10/15/2022   HGB 14.4 10/15/2022   HCT 41.1 10/15/2022   MCV 90 10/15/2022   PLT 374 10/15/2022    Lab Results  Component Value Date   CREATININE 0.92 10/15/2022   BUN 9 10/15/2022   NA 139 10/15/2022   K 4.2 10/15/2022   CL 103 10/15/2022   CO2 21 10/15/2022    Lab Results  Component Value Date   ALT 17 10/15/2022   AST 24 10/15/2022   ALKPHOS 91 10/15/2022   BILITOT 0.4 10/15/2022    Lab Results  Component Value Date   TSH 1.040 10/15/2022     Assessment:   1. Encounter for well woman exam with routine gynecological exam   2. Mass of left breast on mammogram   3. Cervical cancer screening   4. Hyperhidrosis   5. Establishing care with new doctor, encounter for      Plan:  Blood tests: up to date, performed by PCP. Breast self exam technique  reviewed and patient encouraged to perform self-exam monthly.  No mass palpable however noted on mammogram. Needs repeat.  Contraception: Depo-Provera injections. Discussed healthy lifestyle modifications. Mammogram ordered, did not f/u at 6 month interval from last imaging. Screening and f/u diagnostic ordered.   Pap smear ordered. Referrals placed to Dermatology  and for new PCP COVID vaccination status: Declined Flu vaccine: Declined Follow up in 1 year for annual exam   Rubie Maid, MD [place]

## 2023-01-09 ENCOUNTER — Ambulatory Visit (INDEPENDENT_AMBULATORY_CARE_PROVIDER_SITE_OTHER): Payer: BC Managed Care – PPO | Admitting: Obstetrics and Gynecology

## 2023-01-09 ENCOUNTER — Other Ambulatory Visit (HOSPITAL_COMMUNITY)
Admission: RE | Admit: 2023-01-09 | Discharge: 2023-01-09 | Disposition: A | Discharge status: Home / Self Care | Payer: BC Managed Care – PPO | Source: Ambulatory Visit | Attending: Obstetrics and Gynecology | Admitting: Obstetrics and Gynecology

## 2023-01-09 ENCOUNTER — Encounter: Payer: Self-pay | Admitting: Obstetrics and Gynecology

## 2023-01-09 VITALS — BP 116/83 | HR 102 | Resp 16 | Ht 62.0 in | Wt 151.4 lb

## 2023-01-09 DIAGNOSIS — Z01419 Encounter for gynecological examination (general) (routine) without abnormal findings: Secondary | ICD-10-CM

## 2023-01-09 DIAGNOSIS — Z01411 Encounter for gynecological examination (general) (routine) with abnormal findings: Secondary | ICD-10-CM | POA: Diagnosis not present

## 2023-01-09 DIAGNOSIS — N6320 Unspecified lump in the left breast, unspecified quadrant: Secondary | ICD-10-CM | POA: Diagnosis not present

## 2023-01-09 DIAGNOSIS — Z124 Encounter for screening for malignant neoplasm of cervix: Secondary | ICD-10-CM | POA: Insufficient documentation

## 2023-01-09 DIAGNOSIS — Z7689 Persons encountering health services in other specified circumstances: Secondary | ICD-10-CM

## 2023-01-09 DIAGNOSIS — R61 Generalized hyperhidrosis: Secondary | ICD-10-CM

## 2023-01-09 NOTE — Patient Instructions (Addendum)
Preventive Care 40-46 Years Old, Female Preventive care refers to lifestyle choices and visits with your health care provider that can promote health and wellness. Preventive care visits are also called wellness exams. What can I expect for my preventive care visit? Counseling Your health care provider may ask you questions about your: Medical history, including: Past medical problems. Family medical history. Pregnancy history. Current health, including: Menstrual cycle. Method of birth control. Emotional well-being. Home life and relationship well-being. Sexual activity and sexual health. Lifestyle, including: Alcohol, nicotine or tobacco, and drug use. Access to firearms. Diet, exercise, and sleep habits. Work and work environment. Sunscreen use. Safety issues such as seatbelt and bike helmet use. Physical exam Your health care provider will check your: Height and weight. These may be used to calculate your BMI (body mass index). BMI is a measurement that tells if you are at a healthy weight. Waist circumference. This measures the distance around your waistline. This measurement also tells if you are at a healthy weight and may help predict your risk of certain diseases, such as type 2 diabetes and high blood pressure. Heart rate and blood pressure. Body temperature. Skin for abnormal spots. What immunizations do I need?  Vaccines are usually given at various ages, according to a schedule. Your health care provider will recommend vaccines for you based on your age, medical history, and lifestyle or other factors, such as travel or where you work. What tests do I need? Screening Your health care provider may recommend screening tests for certain conditions. This may include: Lipid and cholesterol levels. Diabetes screening. This is done by checking your blood sugar (glucose) after you have not eaten for a while (fasting). Pelvic exam and Pap test. Hepatitis B test. Hepatitis C  test. HIV (human immunodeficiency virus) test. STI (sexually transmitted infection) testing, if you are at risk. Lung cancer screening. Colorectal cancer screening. Mammogram. Talk with your health care provider about when you should start having regular mammograms. This may depend on whether you have a family history of breast cancer. BRCA-related cancer screening. This may be done if you have a family history of breast, ovarian, tubal, or peritoneal cancers. Bone density scan. This is done to screen for osteoporosis. Talk with your health care provider about your test results, treatment options, and if necessary, the need for more tests. Follow these instructions at home: Eating and drinking  Eat a diet that includes fresh fruits and vegetables, whole grains, lean protein, and low-fat dairy products. Take vitamin and mineral supplements as recommended by your health care provider. Do not drink alcohol if: Your health care provider tells you not to drink. You are pregnant, may be pregnant, or are planning to become pregnant. If you drink alcohol: Limit how much you have to 0-1 drink a day. Know how much alcohol is in your drink. In the [place] one drink equals one 12 oz bottle of beer (355 mL), one 5 oz glass of wine (148 mL), or one 1 oz glass of hard liquor (44 mL). Lifestyle Brush your teeth every morning and night with fluoride toothpaste. Floss one time each day. Exercise for at least 30 minutes 5 or more days each week. Do not use any products that contain nicotine or tobacco. These products include cigarettes, chewing tobacco, and vaping devices, such as e-cigarettes. If you need help quitting, ask your health care provider. Do not use drugs. If you are sexually active, practice safe sex. Use a condom or other form of protection to   prevent STIs. If you do not wish to become pregnant, use a form of birth control. If you plan to become pregnant, see your health care provider for a  prepregnancy visit. Take aspirin only as told by your health care provider. Make sure that you understand how much to take and what form to take. Work with your health care provider to find out whether it is safe and beneficial for you to take aspirin daily. Find healthy ways to manage stress, such as: Meditation, yoga, or listening to music. Journaling. Talking to a trusted person. Spending time with friends and family. Minimize exposure to UV radiation to reduce your risk of skin cancer. Safety Always wear your seat belt while driving or riding in a vehicle. Do not drive: If you have been drinking alcohol. Do not ride with someone who has been drinking. When you are tired or distracted. While texting. If you have been using any mind-altering substances or drugs. Wear a helmet and other protective equipment during sports activities. If you have firearms in your house, make sure you follow all gun safety procedures. Seek help if you have been physically or sexually abused. What's next? Visit your health care provider once a year for an annual wellness visit. Ask your health care provider how often you should have your eyes and teeth checked. Stay up to date on all vaccines. This information is not intended to replace advice given to you by your health care provider. Make sure you discuss any questions you have with your health care provider. Document Revised: 06/05/2021 Document Reviewed: 06/05/2021 Elsevier Patient Education  2023 Elsevier Inc.  

## 2023-01-19 LAB — CYTOLOGY - PAP
Comment: NEGATIVE
Diagnosis: UNDETERMINED — AB
High risk HPV: NEGATIVE

## 2023-01-21 ENCOUNTER — Ambulatory Visit
Admission: RE | Admit: 2023-01-21 | Discharge: 2023-01-21 | Disposition: A | Discharge status: Home / Self Care | Payer: BC Managed Care – PPO | Source: Ambulatory Visit | Attending: Obstetrics and Gynecology | Admitting: Obstetrics and Gynecology

## 2023-01-21 DIAGNOSIS — N6320 Unspecified lump in the left breast, unspecified quadrant: Secondary | ICD-10-CM | POA: Diagnosis not present

## 2023-04-16 ENCOUNTER — Ambulatory Visit: Payer: BC Managed Care – PPO | Admitting: Family Medicine

## 2023-04-23 ENCOUNTER — Encounter: Payer: Self-pay | Admitting: Obstetrics and Gynecology

## 2023-05-13 NOTE — Progress Notes (Unsigned)
New patient visit   Patient: Anita Miller   DOB: 03/17/1976   47 y.o. Female  MRN: [id number] Visit Date: 05/14/2023  Today's healthcare provider: Ronnald Ramp, MD   No chief complaint on file.  Subjective    Anita Miller is a 47 y.o. female who presents today as a new patient to establish care.  HPI   Encounter to Establish Care Patient presents to establish care  Introduced myself and my role as primary care physician  We reviewed patient's medical, surgical, and social history and medications as listed below    PMHX   Last annual physical: 10/15/2022  Pap smear:01/09/2023-abnl-sees GYN  HTN Medications: amlodipine-benazepril 5-10mg  capsule  HDL  Medications: ***    Social Hx  Tobacco use: *** Alcohol Use : *** Illicit drug use: ***  ***    Concerns for Today:   ***:   Past Medical History:  Diagnosis Date   Epilepsy (HCC)    Hyperlipidemia    Hypertension    Past Surgical History:  Procedure Laterality Date   COSMETIC SURGERY  08/01/2020   WISDOM TOOTH EXTRACTION Bilateral 2012   Family Status  Relation Name Status   Mother  Alive   Father  Alive   Daughter  Alive   Son  Alive   Neg Hx  (Not Specified)   Family History  Problem Relation Age of Onset   Healthy Mother    Healthy Father    Asthma Daughter    Seizures Son    Breast cancer Neg Hx    Social History   Socioeconomic History   Marital status: Single    Spouse name: Not on file   Number of children: 2   Years of education: 18   Highest education level: Master's degree (e.g., MA, MS, MEng, MEd, MSW, MBA)  Occupational History   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)   Social Determinants of Health   Financial Resource Strain: Low Risk  (07/15/2022)   Overall Financial Resource Strain (CARDIA)    Difficulty of Paying Living Expenses: Not hard at all  Food Insecurity: No Food Insecurity (07/15/2022)   Hunger Vital Sign    Worried About Running Out of Food in the Last Year: Never true    Ran Out of Food in the Last Year: Never true  Transportation Needs: No Transportation Needs (07/15/2022)   PRAPARE - Administrator, Civil Service (Medical): No    Lack of Transportation (Non-Medical): No  Physical Activity: Unknown (12/08/2018)   Exercise Vital Sign    Days of Exercise per Week: Patient declined    Minutes of Exercise per Session: Patient declined  Stress: Unknown (12/08/2018)   [employer] of Occupational Health - Occupational Stress Questionnaire    Feeling of Stress : Patient declined  Social Connections: Unknown (12/08/2018)   Social Connection and Isolation Panel [NHANES]    Frequency of Communication with Friends and Family: Patient declined    Frequency of Social Gatherings with Friends and Family:  Patient declined    Attends Religious Services: Patient declined    Active Member of Clubs or Organizations: Patient declined    Attends Banker Meetings: Patient declined    Marital Status: Patient declined   Outpatient Medications Prior to Visit  Medication Sig   amLODipine-benazepril (LOTREL) 5-10 MG capsule Take 1 capsule by mouth daily.   Bacillus Coagulans-Inulin (ALIGN PREBIOTIC-PROBIOTIC) 5-1.25 MG-GM CHEW Chew by mouth daily. Currently taking Olly Probiotic   lamoTRIgine (LAMICTAL) 100 MG tablet Take 100 mg by mouth daily.   medroxyPROGESTERone (DEPO-PROVERA) 150 MG/ML injection Inject 1 mL (150 mg total) into the muscle every 3 (three) months.   Multiple Vitamin (MULTIVITAMIN ADULT PO) Take  1 tablet by mouth daily. Hair, skin and nails formula   No facility-administered medications prior to visit.   No Known Allergies  Immunization History  Administered Date(s) Administered   Tdap 08/30/2021    Health Maintenance  Topic Date Due   COVID-19 Vaccine (1) 10/31/2026 (Originally 07/17/1976)   INFLUENZA VACCINE  07/23/2023   MAMMOGRAM  01/22/2024   Fecal DNA (Cologuard)  02/04/2025   PAP SMEAR-Modifier  01/09/2026   DTaP/Tdap/Td (2 - Td or Tdap) 08/31/2031   Hepatitis C Screening  Completed   HIV Screening  Completed   HPV VACCINES  Aged Out    Patient Care Team: Jerrol Banana, MD as PCP - General (Family Medicine)  Review of Systems  {Labs  Heme  Chem  Endocrine  Serology  Results Review (optional):[id number]}   Objective    There were no vitals taken for this visit. {Show previous vital signs (optional):23777}  Physical Exam ***  Depression Screen    10/15/2022    9:15 AM 07/15/2022   11:14 AM 03/14/2022    9:37 AM 01/01/2022    9:49 AM  PHQ 2/9 Scores  PHQ - 2 Score 0 0 0 0  PHQ- 9 Score 0 0  0   No results found for any visits on 05/14/23.  Assessment & Plan     ***  No follow-ups on file.     {provider attestation***:1}   Ronnald Ramp, MD  [facility] [phone] (phone) [phone] (fax)  [facility]

## 2023-05-14 ENCOUNTER — Encounter: Payer: Self-pay | Admitting: Family Medicine

## 2023-05-14 ENCOUNTER — Ambulatory Visit: Payer: BC Managed Care – PPO | Admitting: Family Medicine

## 2023-05-14 VITALS — BP 117/82 | HR 86 | Temp 98.4°F | Resp 16 | Ht 62.0 in | Wt 141.4 lb

## 2023-05-14 DIAGNOSIS — I10 Essential (primary) hypertension: Secondary | ICD-10-CM

## 2023-05-14 DIAGNOSIS — L74510 Primary focal hyperhidrosis, axilla: Secondary | ICD-10-CM | POA: Diagnosis not present

## 2023-05-14 DIAGNOSIS — E663 Overweight: Secondary | ICD-10-CM | POA: Insufficient documentation

## 2023-05-14 DIAGNOSIS — G40909 Epilepsy, unspecified, not intractable, without status epilepticus: Secondary | ICD-10-CM | POA: Diagnosis not present

## 2023-05-14 DIAGNOSIS — Z7689 Persons encountering health services in other specified circumstances: Secondary | ICD-10-CM | POA: Insufficient documentation

## 2023-05-14 DIAGNOSIS — E782 Mixed hyperlipidemia: Secondary | ICD-10-CM

## 2023-05-14 NOTE — Assessment & Plan Note (Signed)
Chronic  Stable  Seizure free since 2019  Continue lamtrogine 100mg  daily  CMP, CBC collected today

## 2023-05-14 NOTE — Patient Instructions (Signed)
It was a pleasure meeting you today!  Welcome to [facility]  I look forward to taking part in your care as your new primary care physician.    Summary of our discussion today:   We will follow up with results of labs once they are available.   Please remember to schedule your annual physical one year from your last physical.   You should return to our clinic in 3 months for lipids and blood pressure.   Best Wishes,   Dr. Kimberlee Nearing  Tips to measure your blood pressure correctly  To determine whether you have hypertension, a medical professional will take a blood pressure reading. How you prepare for the test, the position of your arm, and other factors can change a blood pressure reading by 10% or more. That could be enough to hide high blood pressure, start you on a drug you don't really need, or lead your doctor to incorrectly adjust your medications. National and international guidelines offer specific instructions for measuring blood pressure. If a doctor, nurse, or medical assistant isn't doing it right, don't hesitate to ask him or her to get with the guidelines. Here's what you can do to ensure a correct reading:  Don't drink a caffeinated beverage or smoke during the 30 minutes before the test.  Sit quietly for five minutes before the test begins.  During the measurement, sit in a chair with your feet on the floor and your arm supported so your elbow is at about heart level.  The inflatable part of the cuff should completely cover at least 80% of your upper arm, and the cuff should be placed on bare skin, not over a shirt.  Don't talk during the measurement.  Have your blood pressure measured twice, with a brief break in between. If the readings are different by 5 points or more, have it done a third time. There are times to break these rules. If you sometimes feel lightheaded when getting out of bed in the morning or when you stand after sitting,  you should have your blood pressure checked while seated and then while standing to see if it falls from one position to the next. Because blood pressure varies throughout the day, your doctor will rarely diagnose hypertension on the basis of a single reading. Instead, he or she will want to confirm the measurements on at least two occasions, usually within a few weeks of one another. The exception to this rule is if you have a blood pressure reading of 180/110 mm Hg or higher. A result this high usually calls for prompt treatment. It's also a good idea to have your blood pressure measured in both arms at least once, since the reading in one arm (usually the right) may be higher than that in the left. A 2014 study in The American Journal of Medicine of nearly 3,400 people found average arm- to-arm differences in systolic blood pressure of about 5 points. The higher number should be used to make treatment decisions. In 2017, new guidelines from the American Heart Association, the [employer] of Cardiology, and nine other health organizations lowered the diagnosis of high blood pressure to 130/80 mm Hg or higher for all adults. The guidelines also redefined the various blood pressure categories to now include normal, elevated, Stage 1 hypertension, Stage 2 hypertension, and hypertensive crisis (see "Blood pressure categories"). Blood pressure categories  Blood pressure category SYSTOLIC (upper number)  DIASTOLIC (lower number)  Normal Less  than 120 mm Hg and Less than 80 mm Hg  Elevated 120-129 mm Hg and Less than 80 mm Hg  High blood pressure: Stage 1 hypertension 130-139 mm Hg or 80-89 mm Hg  High blood pressure: Stage 2 hypertension 140 mm Hg or higher or 90 mm Hg or higher  Hypertensive crisis (consult your doctor immediately) Higher than 180 mm Hg and/or Higher than 120 mm Hg  Source: American Heart Association and American Stroke Association. For more on getting your blood pressure under  control, buy Controlling Your Blood Pressure, a Special Health Report from [facility].

## 2023-05-14 NOTE — Assessment & Plan Note (Signed)
Welcomed patient to [facility]  Reviewed patient's medical history, medications, surgical and social history Discussed roles and expectations for primary care physician-patient relationship Recommended patient schedule annual preventative examinations   

## 2023-05-14 NOTE — Assessment & Plan Note (Addendum)
Chronic  Continues to have symptoms, not well controlled  Has tried multiple topical therapies  Is planning to meet with derm to discuss other treatment options  CBC, CMP, TSH and free T4

## 2023-05-14 NOTE — Assessment & Plan Note (Signed)
Controlled BP at goal Continue current medications at current doses No medications changes today  CMP collected today  

## 2023-05-14 NOTE — Assessment & Plan Note (Signed)
Chronic  Stable  Patient has been exercising and monitoring dietary intake  Will check A1c, CMP, lipid panel today to screen for metabolic syndrome

## 2023-05-14 NOTE — Assessment & Plan Note (Signed)
Chronic  No current medications  Recommended continued lifestyle management of lipids  Will collect lipid panel and A1c today

## 2023-05-15 LAB — COMPREHENSIVE METABOLIC PANEL
ALT: 13 IU/L (ref 0–32)
AST: 20 IU/L (ref 0–40)
Albumin/Globulin Ratio: 1.8 (ref 1.2–2.2)
Albumin: 4.8 g/dL (ref 3.9–4.9)
Alkaline Phosphatase: 98 IU/L (ref 44–121)
BUN/Creatinine Ratio: 8 — ABNORMAL LOW (ref 9–23)
BUN: 8 mg/dL (ref 6–24)
Bilirubin Total: 0.5 mg/dL (ref 0.0–1.2)
CO2: 22 mmol/L (ref 20–29)
Calcium: 10 mg/dL (ref 8.7–10.2)
Chloride: 104 mmol/L (ref 96–106)
Creatinine, Ser: 0.95 mg/dL (ref 0.57–1.00)
Globulin, Total: 2.7 g/dL (ref 1.5–4.5)
Glucose: 83 mg/dL (ref 70–99)
Potassium: 4.4 mmol/L (ref 3.5–5.2)
Sodium: 141 mmol/L (ref 134–144)
Total Protein: 7.5 g/dL (ref 6.0–8.5)
eGFR: 75 mL/min/{1.73_m2} (ref >59)

## 2023-05-15 LAB — CBC
Hematocrit: 45.5 % (ref 34.0–46.6)
Hemoglobin: 15 g/dL (ref 11.1–15.9)
MCH: 29.6 pg (ref 26.6–33.0)
MCHC: 33 g/dL (ref 31.5–35.7)
MCV: 90 fL (ref 79–97)
Platelets: 426 10*3/uL (ref 150–450)
RBC: 5.06 x10E6/uL (ref 3.77–5.28)
RDW: 12.3 % (ref 11.7–15.4)
WBC: 6.5 10*3/uL (ref 3.4–10.8)

## 2023-05-15 LAB — HEMOGLOBIN A1C
Est. average glucose Bld gHb Est-mCnc: 114 mg/dL
Hgb A1c MFr Bld: 5.6 % (ref 4.8–5.6)

## 2023-05-15 LAB — LIPID PANEL
Chol/HDL Ratio: 4.2 ratio (ref 0.0–4.4)
Cholesterol, Total: 216 mg/dL — ABNORMAL HIGH (ref 100–199)
HDL: 51 mg/dL (ref >39)
LDL Chol Calc (NIH): 148 mg/dL — ABNORMAL HIGH (ref 0–99)
Triglycerides: 93 mg/dL (ref 0–149)
VLDL Cholesterol Cal: 17 mg/dL (ref 5–40)

## 2023-05-15 LAB — TSH+FREE T4
Free T4: 1.31 ng/dL (ref 0.82–1.77)
TSH: 1.61 u[IU]/mL (ref 0.450–4.500)

## 2023-05-28 ENCOUNTER — Encounter: Payer: Self-pay | Admitting: Family Medicine

## 2023-05-29 ENCOUNTER — Other Ambulatory Visit: Payer: Self-pay

## 2023-05-29 DIAGNOSIS — I10 Essential (primary) hypertension: Secondary | ICD-10-CM

## 2023-05-29 MED ORDER — AMLODIPINE BESY-BENAZEPRIL HCL 5-10 MG PO CAPS: 1.0000 | ORAL_CAPSULE | Freq: Every day | ORAL | 1 refills | Status: DC

## 2023-07-02 ENCOUNTER — Ambulatory Visit (INDEPENDENT_AMBULATORY_CARE_PROVIDER_SITE_OTHER): Payer: BC Managed Care – PPO

## 2023-07-02 VITALS — BP 146/97 | HR 86 | Resp 16 | Ht 62.0 in | Wt 147.3 lb

## 2023-07-02 DIAGNOSIS — Z3202 Encounter for pregnancy test, result negative: Secondary | ICD-10-CM | POA: Diagnosis not present

## 2023-07-02 DIAGNOSIS — Z3042 Encounter for surveillance of injectable contraceptive: Secondary | ICD-10-CM

## 2023-07-02 LAB — POCT URINE PREGNANCY: Preg Test, Ur: NEGATIVE

## 2023-07-02 MED ORDER — MEDROXYPROGESTERONE ACETATE 150 MG/ML IM SUSP: 150.0000 mg | Freq: Once | INTRAMUSCULAR | Status: DC

## 2023-07-02 NOTE — Progress Notes (Signed)
    NURSE VISIT NOTE  Subjective:    Patient ID: Anita Miller, female    DOB: 14-Jan-1976, 47 y.o.   MRN: [id number]  HPI  Patient is a 47 y.o. G85P2002 female who presents for depo provera injection.   Objective:    BP (!) 146/97   Pulse 86   Resp 16   Ht 5\' 2"  (1.575 m)   Wt 147 lb 4.8 oz (66.8 kg)   LMP  (LMP Unknown)   BMI 26.94 kg/m   Last Annual: 01/09/2023. Last pap: 01/09/2023. Last Depo-Provera: 12/11/2022. Side Effects if any: none. Serum HCG indicated?  No . Depo-Provera 150 mg IM given by: Sheliah Hatch, CMA. NOT GIVEN TODAY Patient stated that she has had unprotected intercourse and the past 5 days and has not used a back up form of contraception.  Lab Review    Assessment:   1. Encounter for surveillance of injectable contraceptive      Plan:   Will Obtain Urine Pregnancy test today, since patient has had unprotected intercourse in the past 5 days it was recommended per Guadlupe Spanish, CNM that patient wait 2 weeks to receive depo injection since testing for urine pregnancy can be to soon to determine since patient has had unprotected intercourse <5 days ago.     Fonda Kinder, CMA

## 2023-07-15 NOTE — Progress Notes (Addendum)
    NURSE VISIT NOTE  Subjective:    Patient ID: Anita Miller, female    DOB: 07-08-1976, 47 y.o.   MRN: [id number]  HPI  Patient is a 47 y.o. G64P2002 female who presents for depo provera injection. Pregnancy test performed per orders from Guadlupe Spanish, CNM on 07/02/23 when she presented to office for Depo Provera three months past due and having had unprotected intercourse five days prior. Patient is very anxious about pregnancy test today.   Objective:    BP (!) 156/102   Pulse 92   Ht 5\' 2"  (1.575 m)   Wt 146 lb (66.2 kg)   LMP  (LMP Unknown)   BMI 26.70 kg/m   Last Annual: 01/09/23. Last pap: 01/09/23. Last Depo-Provera: 12/11/22. Side Effects if any: none. Serum HCG indicated? No . Depo-Provera 150 mg IM given by: Rocco Serene, LPN. Site: Left Upper Outer Quandrant  Lab Review  @THIS  VISIT ONLY@  Assessment:   1. Encounter for surveillance of injectable contraceptive      Plan:   Elevated BP discussed with Tresea Mall, CNM. Patient advised to monitor BP at home and report back to [place] later today. Follow up with PCP in week for BP Check. Report to ED with severe headache/visual disturbances or BP 160/110.  Next appointment due between 10/10 and 10/15/23.    Rocco Serene, LPN

## 2023-07-15 NOTE — Patient Instructions (Signed)

## 2023-07-16 ENCOUNTER — Ambulatory Visit (INDEPENDENT_AMBULATORY_CARE_PROVIDER_SITE_OTHER): Payer: BC Managed Care – PPO

## 2023-07-16 VITALS — BP 156/102 | HR 92 | Ht 62.0 in | Wt 146.0 lb

## 2023-07-16 DIAGNOSIS — Z3042 Encounter for surveillance of injectable contraceptive: Secondary | ICD-10-CM

## 2023-07-16 DIAGNOSIS — Z3202 Encounter for pregnancy test, result negative: Secondary | ICD-10-CM | POA: Diagnosis not present

## 2023-07-16 LAB — POCT URINE PREGNANCY: Preg Test, Ur: NEGATIVE

## 2023-07-16 MED ORDER — MEDROXYPROGESTERONE ACETATE 150 MG/ML IM SUSP
150.0000 mg | Freq: Once | INTRAMUSCULAR | Status: AC
  Administered 2023-07-16: 150 mg via INTRAMUSCULAR

## 2023-08-12 NOTE — Progress Notes (Deleted)
Established patient visit   Patient: Anita Miller   DOB: [birth year]   47 y.o. Female  MRN: [id number] Visit Date: 08/18/2023  Today's healthcare provider: Ronnald Ramp, MD   No chief complaint on file.  Subjective       Discussed the use of AI scribe software for clinical note transcription with the patient, who gave verbal consent to proceed.  History of Present Illness             Medications: Outpatient Medications Prior to Visit  Medication Sig   amLODipine-benazepril (LOTREL) 5-10 MG capsule Take 1 capsule by mouth daily.   Bacillus Coagulans-Inulin (ALIGN PREBIOTIC-PROBIOTIC) 5-1.25 MG-GM CHEW Chew by mouth daily. Currently taking Olly Probiotic   lamoTRIgine (LAMICTAL) 100 MG tablet Take 100 mg by mouth daily.   medroxyPROGESTERone (DEPO-PROVERA) 150 MG/ML injection Inject 1 mL (150 mg total) into the muscle every 3 (three) months.   Multiple Vitamin (MULTIVITAMIN ADULT PO) Take 1 tablet by mouth daily. Hair, skin and nails formula   No facility-administered medications prior to visit.    Review of Systems  {Insert previous labs (optional):23779} {See past labs  Heme  Chem  Endocrine  Serology  Results Review (optional):1}   Objective    There were no vitals taken for this visit. {Insert last BP/Wt (optional):23777}{See vitals history (optional):1}   Physical Exam  ***  No results found for any visits on 08/18/23.  Assessment & Plan     Problem List Items Addressed This Visit   None   Assessment and Plan              No follow-ups on file.         Ronnald Ramp, MD  [facility] [phone] (phone) [phone] (fax)  [facility]

## 2023-08-18 ENCOUNTER — Ambulatory Visit: Payer: BC Managed Care – PPO | Admitting: Family Medicine

## 2023-08-18 DIAGNOSIS — I10 Essential (primary) hypertension: Secondary | ICD-10-CM

## 2023-08-18 DIAGNOSIS — E782 Mixed hyperlipidemia: Secondary | ICD-10-CM

## 2023-10-08 ENCOUNTER — Ambulatory Visit: Payer: BC Managed Care – PPO

## 2023-10-08 VITALS — BP 145/94 | HR 83 | Ht 62.0 in | Wt 153.5 lb

## 2023-10-08 DIAGNOSIS — I10 Essential (primary) hypertension: Secondary | ICD-10-CM

## 2023-10-08 DIAGNOSIS — Z3042 Encounter for surveillance of injectable contraceptive: Secondary | ICD-10-CM | POA: Diagnosis not present

## 2023-10-08 MED ORDER — MEDROXYPROGESTERONE ACETATE 150 MG/ML IM SUSP
150.0000 mg | Freq: Once | INTRAMUSCULAR | Status: AC
  Administered 2023-10-08: 150 mg via INTRAMUSCULAR

## 2023-10-08 NOTE — Patient Instructions (Addendum)
Medroxyprogesterone Injection (Contraception) What is this medication? MEDROXYPROGESTERONE (me DROX ee proe JES te rone) prevents ovulation and pregnancy. It belongs to a group of medications called contraceptives. This medication is a progestin hormone. This medicine may be used for other purposes; ask your health care provider or pharmacist if you have questions. COMMON BRAND NAME(S): Depo-Provera, Depo-subQ Provera 104 What should I tell my care team before I take this medication? They need to know if you have any of these conditions: Asthma Blood clots Breast cancer or family history of breast cancer Depression Diabetes Eating disorder (anorexia nervosa) Frequently drink alcohol Heart attack High blood pressure HIV infection or AIDS Kidney disease Liver disease Migraine headaches Osteoporosis, weak bones Seizures Stroke Tobacco use Vaginal bleeding An unusual or allergic reaction to medroxyprogesterone, other medications, foods, dyes, or preservatives Pregnant or trying to get pregnant Breast-feeding How should I use this medication? Depo-Provera CI contraceptive injection is given into a muscle. Depo-subQ Provera 104 injection is given under the skin. It is given in a hospital or clinic setting. The injection is usually given during the first 5 days after the start of a menstrual period or 6 weeks after delivery of a baby. A patient package insert for the product will be given with each prescription and refill. Be sure to read this information carefully each time. The sheet may change often. Talk to your care team about the use of this medication in children. Special care may be needed. These injections have been used in female children who have started having menstrual periods. Overdosage: If you think you have taken too much of this medicine contact a poison control center or emergency room at once. NOTE: This medicine is only for you. Do not share this medicine with  others. What if I miss a dose? Keep appointments for follow-up doses. You must get an injection once every 3 months. It is important not to miss your dose. Call your care team if you are unable to keep an appointment. What may interact with this medication? Antibiotics or medications for infections, especially rifampin and griseofulvin Antivirals for HIV or hepatitis Aprepitant Armodafinil Bexarotene Bosentan Medications for seizures, such as carbamazepine, felbamate, oxcarbazepine, phenytoin, phenobarbital, primidone, topiramate Mitotane Modafinil St. John's Wort This list may not describe all possible interactions. Give your health care provider a list of all the medicines, herbs, non-prescription drugs, or dietary supplements you use. Also tell them if you smoke, drink alcohol, or use illegal drugs. Some items may interact with your medicine. What should I watch for while using this medication? This medication does not protect you against HIV infection (AIDS) or other sexually transmitted diseases. Use of this product may cause you to lose calcium from your bones. Loss of calcium may cause weak bones (osteoporosis). Only use this product for more than 2 years if other forms of birth control are not right for you. The longer you use this product for birth control the more likely you will be at risk for weak bones. Ask your care team how you can keep strong bones. You may have a change in bleeding pattern or irregular periods. Many females stop having periods while taking this medication. If you have received your injections on time, your chance of being pregnant is very low. If you think you may be pregnant, see your care team as soon as possible. Tell your care team if you want to get pregnant within the next year. The effect of this medication may last a long time  after you get your last injection. What side effects may I notice from receiving this medication? Side effects that you should  report to your care team as soon as possible: Allergic reactions--skin rash, itching, hives, swelling of the face, lips, tongue, or throat Blood clot--pain, swelling, or warmth in the leg, shortness of breath, chest pain Gallbladder problems--severe stomach pain, nausea, vomiting, fever Increase in blood pressure Liver injury--right upper belly pain, loss of appetite, nausea, light-colored stool, dark yellow or brown urine, yellowing skin or eyes, unusual weakness or fatigue New or worsening migraines or headaches Seizures Stroke--sudden numbness or weakness of the face, arm, or leg, trouble speaking, confusion, trouble walking, loss of balance or coordination, dizziness, severe headache, change in vision Unusual vaginal discharge, itching, or odor Worsening mood, feelings of depression Side effects that usually do not require medical attention (report to your care team if they continue or are bothersome): Breast pain or tenderness Dark patches of the skin on the face or other sun-exposed areas Irregular menstrual cycles or spotting Nausea Weight gain This list may not describe all possible side effects. Call your doctor for medical advice about side effects. You may report side effects to FDA at 1-800-FDA-1088. Where should I keep my medication? This injection is only given by a care team. It will not be stored at home. NOTE: This sheet is a summary. It may not cover all possible information. If you have questions about this medicine, talk to your doctor, pharmacist, or health care provider.  2024 Elsevier/Gold Standard (2021-08-21 00:00:00)   Managing Your Hypertension Hypertension, also called high blood pressure, is when the force of the blood pressing against the walls of the arteries is too strong. Arteries are blood vessels that carry blood from your heart throughout your body. Hypertension forces the heart to work harder to pump blood and may cause the arteries to become narrow or  stiff. Understanding blood pressure readings A blood pressure reading includes a higher number over a lower number: The first, or top, number is called the systolic pressure. It is a measure of the pressure in your arteries as your heart beats. The second, or bottom number, is called the diastolic pressure. It is a measure of the pressure in your arteries as the heart relaxes. For most people, a normal blood pressure is below 120/80. Your personal target blood pressure may vary depending on your medical conditions, your age, and other factors. Blood pressure is classified into four stages. Based on your blood pressure reading, your health care provider may use the following stages to determine what type of treatment you need, if any. Systolic pressure and diastolic pressure are measured in a unit called millimeters of mercury (mmHg). Normal Systolic pressure: below 120. Diastolic pressure: below 80. Elevated Systolic pressure: 120-129. Diastolic pressure: below 80. Hypertension stage 1 Systolic pressure: 130-139. Diastolic pressure: 80-89. Hypertension stage 2 Systolic pressure: 140 or above. Diastolic pressure: 90 or above. How can this condition affect me? Managing your hypertension is very important. Over time, hypertension can damage the arteries and decrease blood flow to parts of the body, including the brain, heart, and kidneys. Having untreated or uncontrolled hypertension can lead to: A heart attack. A stroke. A weakened blood vessel (aneurysm). Heart failure. Kidney damage. Eye damage. Memory and concentration problems. Vascular dementia. What actions can I take to manage this condition? Hypertension can be managed by making lifestyle changes and possibly by taking medicines. Your health care provider will help you make a plan to  bring your blood pressure within a normal range. You may be referred for counseling on a healthy diet and physical activity. Nutrition  Eat a diet  that is high in fiber and potassium, and low in salt (sodium), added sugar, and fat. An example eating plan is called the DASH diet. DASH stands for Dietary Approaches to Stop Hypertension. To eat this way: Eat plenty of fresh fruits and vegetables. Try to fill one-half of your plate at each meal with fruits and vegetables. Eat whole grains, such as whole-wheat pasta, brown rice, or whole-grain bread. Fill about one-fourth of your plate with whole grains. Eat low-fat dairy products. Avoid fatty cuts of meat, processed or cured meats, and poultry with skin. Fill about one-fourth of your plate with lean proteins such as fish, chicken without skin, beans, eggs, and tofu. Avoid pre-made and processed foods. These tend to be higher in sodium, added sugar, and fat. Reduce your daily sodium intake. Many people with hypertension should eat less than 1,500 mg of sodium a day. Lifestyle  Work with your health care provider to maintain a healthy body weight or to lose weight. Ask what an ideal weight is for you. Get at least 30 minutes of exercise that causes your heart to beat faster (aerobic exercise) most days of the week. Activities may include walking, swimming, or biking. Include exercise to strengthen your muscles (resistance exercise), such as weight lifting, as part of your weekly exercise routine. Try to do these types of exercises for 30 minutes at least 3 days a week. Do not use any products that contain nicotine or tobacco. These products include cigarettes, chewing tobacco, and vaping devices, such as e-cigarettes. If you need help quitting, ask your health care provider. Control any long-term (chronic) conditions you have, such as high cholesterol or diabetes. Identify your sources of stress and find ways to manage stress. This may include meditation, deep breathing, or making time for fun activities. Alcohol use Do not drink alcohol if: Your health care provider tells you not to drink. You are  pregnant, may be pregnant, or are planning to become pregnant. If you drink alcohol: Limit how much you have to: 0-1 drink a day for women. 0-2 drinks a day for men. Know how much alcohol is in your drink. In the [place] one drink equals one 12 oz bottle of beer (355 mL), one 5 oz glass of wine (148 mL), or one 1 oz glass of hard liquor (44 mL). Medicines Your health care provider may prescribe medicine if lifestyle changes are not enough to get your blood pressure under control and if: Your systolic blood pressure is 130 or higher. Your diastolic blood pressure is 80 or higher. Take medicines only as told by your health care provider. Follow the directions carefully. Blood pressure medicines must be taken as told by your health care provider. The medicine does not work as well when you skip doses. Skipping doses also puts you at risk for problems. Monitoring Before you monitor your blood pressure: Do not smoke, drink caffeinated beverages, or exercise within 30 minutes before taking a measurement. Use the bathroom and empty your bladder (urinate). Sit quietly for at least 5 minutes before taking measurements. Monitor your blood pressure at home as told by your health care provider. To do this: Sit with your back straight and supported. Place your feet flat on the floor. Do not cross your legs. Support your arm on a flat surface, such as a table. Make sure your  upper arm is at heart level. Each time you measure, take two or three readings one minute apart and record the results. You may also need to have your blood pressure checked regularly by your health care provider. General information Talk with your health care provider about your diet, exercise habits, and other lifestyle factors that may be contributing to hypertension. Review all the medicines you take with your health care provider because there may be side effects or interactions. Keep all follow-up visits. Your health care  provider can help you create and adjust your plan for managing your high blood pressure. Where to find more information National Heart, Lung, and Blood Institute: [employer]is American Heart Association: www.heart.org Contact a health care provider if: You think you are having a reaction to medicines you have taken. You have repeated (recurrent) headaches. You feel dizzy. You have swelling in your ankles. You have trouble with your vision. Get help right away if: You develop a severe headache or confusion. You have unusual weakness or numbness, or you feel faint. You have severe pain in your chest or abdomen. You vomit repeatedly. You have trouble breathing. These symptoms may be an emergency. Get help right away. Call 911. Do not wait to see if the symptoms will go away. Do not drive yourself to the hospital. Summary Hypertension is when the force of blood pumping through your arteries is too strong. If this condition is not controlled, it may put you at risk for serious complications. Your personal target blood pressure may vary depending on your medical conditions, your age, and other factors. For most people, a normal blood pressure is less than 120/80. Hypertension is managed by lifestyle changes, medicines, or both. Lifestyle changes to help manage hypertension include losing weight, eating a healthy, low-sodium diet, exercising more, stopping smoking, and limiting alcohol. This information is not intended to replace advice given to you by your health care provider. Make sure you discuss any questions you have with your health care provider. Document Revised: 08/22/2021 Document Reviewed: 08/22/2021 Elsevier Patient Education  2024 [employer].

## 2023-10-08 NOTE — Progress Notes (Signed)
    NURSE VISIT NOTE  Subjective:    Patient ID: Anita Miller, female    DOB: 05-11-1976, 47 y.o.   MRN: [id number]  HPI  Patient is a 47 y.o. G26P2002 female who presents for depo provera injection.   Objective:    BP (!) 145/94   Pulse 83   Ht 5\' 2"  (1.575 m)   Wt 153 lb 8 oz (69.6 kg)   BMI 28.08 kg/m   Last Annual: 01/09/23. Last pap: 01/09/23. Last Depo-Provera: 07/16/23. Side Effects if any: none. Serum HCG indicated? No . Depo-Provera 150 mg IM given by: Sheliah Hatch, CMA. Site: Right Upper Outer [place]andrant  Lab Review    Assessment:   1. Encounter for surveillance of injectable contraceptive      Plan:   Next appointment due between 12/24/23 and 01/07/24. Blood pressure readings in office today were both elevated >140/90. Patient states that she has a history of high blood pressure and is currently on Amlodipine. Patient states that she has been fasting for the last 12hrs and has not drank. Patient encouraged to follow a balanced diet and increase fluids, patient advised to continue to check blood pressure readings at home and take medication as prescribed. Advised patient that if blood pressure readings at home are staying elevated to contact her PCP to follow up in regards to her hypertension. Patient verbalized understanding.   Fonda Kinder, CMA

## 2023-11-23 NOTE — Progress Notes (Unsigned)
      Established patient visit   Patient: Anita Miller   DOB: 10/24/1976   47 y.o. Female  MRN: [id number] Visit Date: 11/24/2023  Today's healthcare provider: Ronnald Ramp, MD   No chief complaint on file.  Subjective       Discussed the use of AI scribe software for clinical note transcription with the patient, who gave verbal consent to proceed.  History of Present Illness             Past Medical History:  Diagnosis Date   Epilepsy (HCC)    Hyperlipidemia    Hypertension     Medications: Outpatient Medications Prior to Visit  Medication Sig   amLODipine-benazepril (LOTREL) 5-10 MG capsule Take 1 capsule by mouth daily.   Bacillus Coagulans-Inulin (ALIGN PREBIOTIC-PROBIOTIC) 5-1.25 MG-GM CHEW Chew by mouth daily. Currently taking Olly Probiotic   lamoTRIgine (LAMICTAL) 100 MG tablet Take 100 mg by mouth daily.   medroxyPROGESTERone (DEPO-PROVERA) 150 MG/ML injection Inject 1 mL (150 mg total) into the muscle every 3 (three) months.   Multiple Vitamin (MULTIVITAMIN ADULT PO) Take 1 tablet by mouth daily. Hair, skin and nails formula   No facility-administered medications prior to visit.    Review of Systems  {Insert previous labs (optional):23779} {See past labs  Heme  Chem  Endocrine  Serology  Results Review (optional):1}   Objective    There were no vitals taken for this visit. {Insert last BP/Wt (optional):23777}{See vitals history (optional):1}    Physical Exam  ***  No results found for any visits on 11/24/23.  Assessment & Plan     Problem List Items Addressed This Visit   None   Assessment and Plan              No follow-ups on file.         Ronnald Ramp, MD  [facility] [phone] (phone) [phone] (fax)  [facility]

## 2023-11-24 ENCOUNTER — Encounter: Payer: Self-pay | Admitting: Family Medicine

## 2023-11-24 ENCOUNTER — Ambulatory Visit: Payer: BC Managed Care – PPO | Admitting: Family Medicine

## 2023-11-24 VITALS — BP 150/90 | HR 100 | Resp 16 | Ht 62.0 in | Wt 154.0 lb

## 2023-11-24 DIAGNOSIS — E782 Mixed hyperlipidemia: Secondary | ICD-10-CM | POA: Diagnosis not present

## 2023-11-24 DIAGNOSIS — E663 Overweight: Secondary | ICD-10-CM

## 2023-11-24 DIAGNOSIS — L74510 Primary focal hyperhidrosis, axilla: Secondary | ICD-10-CM

## 2023-11-24 DIAGNOSIS — I10 Essential (primary) hypertension: Secondary | ICD-10-CM

## 2023-11-24 MED ORDER — AMLODIPINE BESY-BENAZEPRIL HCL 5-10 MG PO CAPS: 2.0000 | ORAL_CAPSULE | Freq: Every day | ORAL | 1 refills | Status: DC

## 2023-11-24 NOTE — Patient Instructions (Addendum)
DASH Eating Plan DASH stands for Dietary Approaches to Stop Hypertension. The DASH eating plan is a healthy eating plan that has been shown to: Lower high blood pressure (hypertension). Reduce your risk for type 2 diabetes, heart disease, and stroke. Help with weight loss. What are tips for following this plan? Reading food labels Check food labels for the amount of salt (sodium) per serving. Choose foods with less than 5 percent of the Daily Value (DV) of sodium. In general, foods with less than 300 milligrams (mg) of sodium per serving fit into this eating plan. To find whole grains, look for the word "whole" as the first word in the ingredient list. Shopping Buy products labeled as "low-sodium" or "no salt added." Buy fresh foods. Avoid canned foods and pre-made or frozen meals. Cooking Try not to add salt when you cook. Use salt-free seasonings or herbs instead of table salt or sea salt. Check with your health care provider or pharmacist before using salt substitutes. Do not fry foods. Cook foods in healthy ways, such as baking, boiling, grilling, roasting, or broiling. Cook using oils that are good for your heart. These include olive, canola, avocado, soybean, and sunflower oil. Meal planning  Eat a balanced diet. This should include: 4 or more servings of fruits and 4 or more servings of vegetables each day. Try to fill half of your plate with fruits and vegetables. 6-8 servings of whole grains each day. 6 or less servings of lean meat, poultry, or fish each day. 1 oz is 1 serving. A 3 oz (85 g) serving of meat is about the same size as the palm of your hand. One egg is 1 oz (28 g). 2-3 servings of low-fat dairy each day. One serving is 1 cup (237 mL). 1 serving of nuts, seeds, or beans 5 times each week. 2-3 servings of heart-healthy fats. Healthy fats called omega-3 fatty acids are found in foods such as walnuts, flaxseeds, fortified milks, and eggs. These fats are also found in  cold-water fish, such as sardines, salmon, and mackerel. Limit how much you eat of: Canned or prepackaged foods. Food that is high in trans fat, such as fried foods. Food that is high in saturated fat, such as fatty meat. Desserts and other sweets, sugary drinks, and other foods with added sugar. Full-fat dairy products. Do not salt foods before eating. Do not eat more than 4 egg yolks a week. Try to eat at least 2 vegetarian meals a week. Eat more home-cooked food and less restaurant, buffet, and fast food. Lifestyle When eating at a restaurant, ask if your food can be made with less salt or no salt. If you drink alcohol: Limit how much you have to: 0-1 drink a day if you are female. 0-2 drinks a day if you are female. Know how much alcohol is in your drink. In the [place] one drink is one 12 oz bottle of beer (355 mL), one 5 oz glass of wine (148 mL), or one 1 oz glass of hard liquor (44 mL). General information Avoid eating more than 2,300 mg of salt a day. If you have hypertension, you may need to reduce your sodium intake to 1,500 mg a day. Work with your provider to stay at a healthy body weight or lose weight. Ask what the best weight range is for you. On most days of the week, get at least 30 minutes of exercise that causes your heart to beat faster. This may include walking, swimming, or  biking. Work with your provider or dietitian to adjust your eating plan to meet your specific calorie needs. What foods should I eat? Fruits All fresh, dried, or frozen fruit. Canned fruits that are in their natural juice and do not have sugar added to them. Vegetables Fresh or frozen vegetables that are raw, steamed, roasted, or grilled. Low-sodium or reduced-sodium tomato and vegetable juice. Low-sodium or reduced-sodium tomato sauce and tomato paste. Low-sodium or reduced-sodium canned vegetables. Grains Whole-grain or whole-wheat bread. Whole-grain or whole-wheat pasta. Brown rice. Orpah Cobb. Bulgur. Whole-grain and low-sodium cereals. Pita bread. Low-fat, low-sodium crackers. Whole-wheat flour tortillas. Meats and other proteins Skinless chicken or [place]. Ground chicken or [place]. Pork with fat trimmed off. Fish and seafood. Egg whites. Dried beans, peas, or lentils. Unsalted nuts, nut butters, and seeds. Unsalted canned beans. Lean cuts of beef with fat trimmed off. Low-sodium, lean precooked or cured meat, such as sausages or meat loaves. Dairy Low-fat (1%) or fat-free (skim) milk. Reduced-fat, low-fat, or fat-free cheeses. Nonfat, low-sodium ricotta or cottage cheese. Low-fat or nonfat yogurt. Low-fat, low-sodium cheese. Fats and oils Soft margarine without trans fats. Vegetable oil. Reduced-fat, low-fat, or light mayonnaise and salad dressings (reduced-sodium). Canola, safflower, olive, avocado, soybean, and sunflower oils. Avocado. Seasonings and condiments Herbs. Spices. Seasoning mixes without salt. Other foods Unsalted popcorn and pretzels. Fat-free sweets. The items listed above may not be all the foods and drinks you can have. Talk to a dietitian to learn more. What foods should I avoid? Fruits Canned fruit in a light or heavy syrup. Fried fruit. Fruit in cream or butter sauce. Vegetables Creamed or fried vegetables. Vegetables in a cheese sauce. Regular canned vegetables that are not marked as low-sodium or reduced-sodium. Regular canned tomato sauce and paste that are not marked as low-sodium or reduced-sodium. Regular tomato and vegetable juices that are not marked as low-sodium or reduced-sodium. Rosita Fire. Olives. Grains Baked goods made with fat, such as croissants, muffins, or some breads. Dry pasta or rice meal packs. Meats and other proteins Fatty cuts of meat. Ribs. Fried meat. Tomasa Blase. Bologna, salami, and other precooked or cured meats, such as sausages or meat loaves, that are not lean and low in sodium. Fat from the back of a pig (fatback). Bratwurst.  Salted nuts and seeds. Canned beans with added salt. Canned or smoked fish. Whole eggs or egg yolks. Chicken or [place] with skin. Dairy Whole or 2% milk, cream, and half-and-half. Whole or full-fat cream cheese. Whole-fat or sweetened yogurt. Full-fat cheese. Nondairy creamers. Whipped toppings. Processed cheese and cheese spreads. Fats and oils Butter. Stick margarine. Lard. Shortening. Ghee. Bacon fat. Tropical oils, such as coconut, palm kernel, or palm oil. Seasonings and condiments Onion salt, garlic salt, seasoned salt, table salt, and sea salt. Worcestershire sauce. Tartar sauce. Barbecue sauce. Teriyaki sauce. Soy sauce, including reduced-sodium soy sauce. Steak sauce. Canned and packaged gravies. Fish sauce. Oyster sauce. Cocktail sauce. Store-bought horseradish. Ketchup. Mustard. Meat flavorings and tenderizers. Bouillon cubes. Hot sauces. Pre-made or packaged marinades. Pre-made or packaged taco seasonings. Relishes. Regular salad dressings. Other foods Salted popcorn and pretzels. The items listed above may not be all the foods and drinks you should avoid. Talk to a dietitian to learn more. Where to find more information National Heart, Lung, and Blood Institute (NHLBI): BuffaloDryCleaner.gl American Heart Association (AHA): heart.org Academy of Nutrition and Dietetics: eatright.org National Kidney Foundation (NKF): kidney.org This information is not intended to replace advice given to you by your health care provider. Make sure  you discuss any questions you have with your health care provider. Document Revised: 12/25/2022 Document Reviewed: 12/25/2022 Elsevier Patient Education  2024 Elsevier Inc.    How to Take Your Blood Pressure Blood pressure measures how strongly your blood is pressing against the walls of your arteries. Arteries are blood vessels that carry blood from your heart throughout your body. You can take your blood pressure at home with a machine. You may need to check  your blood pressure at home: To check if you have high blood pressure (hypertension). To check your blood pressure over time. To make sure your blood pressure medicine is working. Supplies needed: Blood pressure machine, or monitor. A chair to sit in. This should be a chair where you can sit upright with your back supported. Do not sit on a soft couch or an armchair. Table or desk. Small notebook. Pencil or pen. How to prepare Avoid these things for 30 minutes before checking your blood pressure: Having drinks with caffeine in them, such as coffee or tea. Drinking alcohol. Eating. Smoking. Exercising. Do these things five minutes before checking your blood pressure: Go to the bathroom and pee (urinate). Sit in a chair. Be quiet. Do not talk. How to take your blood pressure Follow the instructions that came with your machine. If you have a digital blood pressure monitor, these may be the instructions: Sit up straight. Place your feet on the floor. Do not cross your ankles or legs. Rest your left arm at the level of your heart. You may rest it on a table, desk, or chair. Pull up your shirt sleeve. Wrap the blood pressure cuff around the upper part of your left arm. The cuff should be 1 inch (2.5 cm) above your elbow. It is best to wrap the cuff around bare skin. Fit the cuff snugly around your arm, but not too tightly. You should be able to place only one finger between the cuff and your arm. Place the cord so that it rests in the bend of your elbow. Press the power button. Sit quietly while the cuff fills with air and loses air. Write down the numbers on the screen. Wait 2-3 minutes and then repeat steps 1-10. What do the numbers mean? Two numbers make up your blood pressure. The first number is called systolic pressure. The second is called diastolic pressure. An example of a blood pressure reading is "120 over 80" (or 120/80). If you are an adult and do not have a medical  condition, use this guide to find out if your blood pressure is normal: Normal First number: below 120. Second number: below 80. Elevated First number: 120-129. Second number: below 80. Hypertension stage 1 First number: 130-139. Second number: 80-89. Hypertension stage 2 First number: 140 or above. Second number: 90 or above. Your blood pressure is above normal even if only the first or only the second number is above normal. Follow these instructions at home: Medicines Take over-the-counter and prescription medicines only as told by your doctor. Tell your doctor if your medicine is causing side effects. General instructions Check your blood pressure as often as your doctor tells you to. Check your blood pressure at the same time every day. Take your monitor to your next doctor's appointment. Your doctor will: Make sure you are using it correctly. Make sure it is working right. Understand what your blood pressure numbers should be. Keep all follow-up visits. General tips You will need a blood pressure machine or monitor. Your doctor  can suggest a monitor. You can buy one at a drugstore or online. When choosing one: Choose one with an arm cuff. Choose one that wraps around your upper arm. Only one finger should fit between your arm and the cuff. Do not choose one that measures your blood pressure from your wrist or finger. Where to find more information American Heart Association: www.heart.org Contact a doctor if: Your blood pressure keeps being high. Your blood pressure is suddenly low. Get help right away if: Your first blood pressure number is higher than 180. Your second blood pressure number is higher than 120. These symptoms may be an emergency. Do not wait to see if the symptoms will go away. Get help right away. Call 911. Summary Check your blood pressure at the same time every day. Avoid caffeine, alcohol, smoking, and exercise for 30 minutes before checking your  blood pressure. Make sure you understand what your blood pressure numbers should be. This information is not intended to replace advice given to you by your health care provider. Make sure you discuss any questions you have with your health care provider. Document Revised: 08/22/2021 Document Reviewed: 08/22/2021 Elsevier Patient Education  2024 Elsevier Inc.    VISIT SUMMARY:  During today's visit, we reviewed your current health status, focusing on your hypertension, cholesterol levels, weight management, and hyperhidrosis. We discussed adjustments to your medication, lifestyle changes, and upcoming appointments to better manage your conditions.  YOUR PLAN:  -HYPERTENSION: Hypertension, or high blood pressure, can lead to serious health problems if not managed properly. Your blood pressure was elevated today, so we are increasing your amlodipine to 10 mg daily and benazepril to 20 mg daily. Please monitor your blood pressure at home and record your readings. We will have a virtual follow-up in two weeks and have ordered blood work to check your potassium and kidney function.  -HYPERLIPIDEMIA: Hyperlipidemia means you have high levels of cholesterol in your blood, which can increase your risk of heart disease. Your LDL cholesterol is elevated, so we recommend regular exercise and following the DASH diet to help lower your cholesterol levels.  -OVERWEIGHT: Being overweight can affect your overall health and contribute to conditions like hypertension. You have started taking Zepbound 2.5 mg for weight loss, which has reduced your appetite. Continue with this medication and aim for a 5% reduction in your body weight. Please check with Monia Pouch for insurance coverage of weight loss medications.  -HYPERHIDROSIS: Hyperhidrosis is excessive sweating, particularly under the arms. You have a dermatology appointment scheduled for January 13, 2024, to explore further treatment options since previous  treatments have been ineffective.  -GENERAL HEALTH MAINTENANCE: We discussed the importance of getting a flu shot to protect against the flu. You received your flu vaccination today.  INSTRUCTIONS:  Please monitor your blood pressure at home and record your readings. We will have a virtual follow-up in two weeks. Additionally, we have ordered blood work to check your potassium and kidney function. Continue with your current weight loss regimen and check with [employer] for insurance coverage of weight loss medications. Attend your dermatology appointment on January 13, 2024.

## 2023-11-24 NOTE — Assessment & Plan Note (Signed)
BMI of 28. Started Zepbound 2.5 mg for weight loss. Initial response includes reduced appetite and mild cramps. Discussed benefits of weight loss on hypertension and overall health. Advised to check with insurance for coverage of weight loss medications. Goal is a 5% reduction in body weight, approximately 10 pounds. 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

## 2023-11-24 NOTE — Assessment & Plan Note (Signed)
Elevated LDL cholesterol of 148 mg/dL and total cholesterol of 216 mg/dL as of May 1610. ASCVD risk score is 6.4%. Discussed lifestyle modifications including regular exercise and dietary changes to lower cholesterol levels. Goal is reducing LDL cholesterol to under 100 mg/dL. - Encourage regular exercise - Recommend DASH diet to reduce sodium intake, see AVS

## 2023-11-24 NOTE — Assessment & Plan Note (Signed)
Reports excessive sweating, particularly under the arms. Previous treatments including oral medications and topicals have been ineffective. Dermatology appointment scheduled for January 13, 2024. Discussed potential future treatments such as Botox if current treatments remain ineffective. - chronic, not responsive to prior oral or topical therapies - Attend dermatology appointment on January 13, 2024

## 2023-11-24 NOTE — Assessment & Plan Note (Signed)
Hypertension follow-up. Current regimen includes amlodipine 5 mg and benazepril 10 mg daily. Blood pressure today is 154/102 mmHg. No dizziness reported. Home monitoring shows variable readings. Discussed maintaining blood pressure under 130/80 mmHg to prevent complications such as stroke. Weight loss and exercise are beneficial but may not suffice alone. Discussed increasing medication dosage to better control blood pressure. Informed about potential side effects such as dizziness and leg swelling. Chronic, not well controlled yet  - Increase amlodipine to 10 mg daily - Increase benazepril to 20 mg daily - Monitor blood pressure at home and record readings - Schedule virtual follow-up in two weeks - Order blood work to check potassium and kidney function

## 2023-11-25 LAB — BASIC METABOLIC PANEL
BUN/Creatinine Ratio: 6 — ABNORMAL LOW (ref 9–23)
BUN: 7 mg/dL (ref 6–24)
CO2: 20 mmol/L (ref 20–29)
Calcium: 10.1 mg/dL (ref 8.7–10.2)
Chloride: 105 mmol/L (ref 96–106)
Creatinine, Ser: 1.08 mg/dL — ABNORMAL HIGH (ref 0.57–1.00)
Glucose: 92 mg/dL (ref 70–99)
Potassium: 4.6 mmol/L (ref 3.5–5.2)
Sodium: 143 mmol/L (ref 134–144)
eGFR: 64 mL/min/{1.73_m2} (ref >59)

## 2023-12-02 ENCOUNTER — Encounter: Payer: Self-pay | Admitting: Family Medicine

## 2023-12-03 ENCOUNTER — Other Ambulatory Visit: Payer: Self-pay

## 2023-12-03 DIAGNOSIS — I10 Essential (primary) hypertension: Secondary | ICD-10-CM

## 2023-12-03 MED ORDER — AMLODIPINE BESY-BENAZEPRIL HCL 10-20 MG PO CAPS: 1.0000 | ORAL_CAPSULE | Freq: Every day | ORAL | 5 refills | Status: DC

## 2023-12-10 ENCOUNTER — Encounter: Payer: Self-pay | Admitting: Family Medicine

## 2023-12-10 ENCOUNTER — Encounter: Payer: BC Managed Care – PPO | Admitting: Family Medicine

## 2023-12-10 ENCOUNTER — Telehealth: Payer: BC Managed Care – PPO | Admitting: Family Medicine

## 2023-12-10 VITALS — BP 149/92 | HR 91 | Wt 145.8 lb

## 2023-12-10 DIAGNOSIS — E663 Overweight: Secondary | ICD-10-CM | POA: Diagnosis not present

## 2023-12-10 DIAGNOSIS — I10 Essential (primary) hypertension: Secondary | ICD-10-CM | POA: Diagnosis not present

## 2023-12-10 DIAGNOSIS — Z6826 Body mass index (BMI) 26.0-26.9, adult: Secondary | ICD-10-CM

## 2023-12-10 NOTE — Progress Notes (Signed)
MyChart Video Visit    Virtual Visit via Video Note   This format is felt to be most appropriate for this patient at this time. Physical exam was limited by quality of the video and audio technology used for the visit.   Patient location: Patient's home address   Provider location: [facility]  [address]  [place], [place] [address]   I discussed the limitations of evaluation and management by telemedicine and the availability of in person appointments. The patient expressed understanding and agreed to proceed.  Patient: Anita Miller   DOB: [birth year]   47 y.o. Female  MRN: [id number] Visit Date: 12/10/2023  Today's healthcare provider: Ronnald Ramp, MD   No chief complaint on file.  Subjective    HPI   Discussed the use of AI scribe software for clinical note transcription with the patient, who gave verbal consent to proceed.  History of Present Illness   The patient is a 47 year old female with a history of hypertension, who was last seen on December 3rd. At that time, her blood pressure was poorly controlled, leading to an increase in her amlodipine to 10mg  and benazepril to 20mg . Her creatinine was 1.08 and potassium was 4.6 at the time of the last visit.  The patient reports that her blood pressure readings have been high, with readings such as 170/118. However, she suspected that her blood pressure cuff, which was 70-24 years old, might be inaccurate. After purchasing a new cuff, her readings were 149/92 and 154/88. These readings were taken in the morning, after she had been up and moving around the house. She takes her blood pressure medications at night.  The patient denies any symptoms such as headaches or vision changes. She has been tolerating the increased doses of amlodipine and benazepril well. She also reports no side effects from tirzepatide. She has been avoiding fried foods due to a lack of appetite and the smell of  grease being off-putting since starting the medication.  The patient acknowledges that she needs to make more lifestyle changes. She has been forcing herself to eat due to a lack of appetite (CURRENTLY ON ZEPBOUND), often resorting to protein shakes and frozen fruit. Despite this, she is pleased with her recent weight loss, reporting a weight of 145.8 pounds, down from 154 pounds at the last visit.  The patient is aware that her blood pressure needs to be lower and is open to adding another medication to her regimen. She expresses a desire to eventually be able to reduce her medication load once her blood pressure is better controlled.       Past Medical History:  Diagnosis Date   Epilepsy (HCC)    Hyperlipidemia    Hypertension     Medications: Outpatient Medications Prior to Visit  Medication Sig   amLODipine-benazepril (LOTREL) 10-20 MG capsule Take 1 capsule by mouth daily.   Bacillus Coagulans-Inulin (ALIGN PREBIOTIC-PROBIOTIC) 5-1.25 MG-GM CHEW Chew by mouth daily. Currently taking Olly Probiotic   lamoTRIgine (LAMICTAL) 100 MG tablet Take 100 mg by mouth daily.   medroxyPROGESTERone (DEPO-PROVERA) 150 MG/ML injection Inject 1 mL (150 mg total) into the muscle every 3 (three) months.   Multiple Vitamin (MULTIVITAMIN ADULT PO) Take 1 tablet by mouth daily. Hair, skin and nails formula   tirzepatide (ZEPBOUND) 2.5 MG/0.5ML Pen Inject 2.5 mg into the skin once a week.   No facility-administered medications prior to visit.    Review of Systems  Last metabolic panel Lab  Results  Component Value Date   GLUCOSE 92 11/24/2023   NA 143 11/24/2023   K 4.6 11/24/2023   CL 105 11/24/2023   CO2 20 11/24/2023   BUN 7 11/24/2023   CREATININE 1.08 (H) 11/24/2023   EGFR 64 11/24/2023   CALCIUM 10.1 11/24/2023   PROT 7.5 05/14/2023   ALBUMIN 4.8 05/14/2023   LABGLOB 2.7 05/14/2023   AGRATIO 1.8 05/14/2023   BILITOT 0.5 05/14/2023   ALKPHOS 98 05/14/2023   AST 20 05/14/2023   ALT  13 05/14/2023   ANIONGAP 8 12/09/2018        Objective    BP (!) 149/92 Comment: home BP, 8:15 A  Pulse 91   Wt 145 lb 12.8 oz (66.1 kg)   BMI 26.67 kg/m   BP Readings from Last 3 Encounters:  12/10/23 (!) 149/92  11/24/23 (!) 150/90  10/08/23 (!) 145/94   Wt Readings from Last 3 Encounters:  12/10/23 145 lb 12.8 oz (66.1 kg)  11/24/23 154 lb (69.9 kg)  10/08/23 153 lb 8 oz (69.6 kg)        Physical Exam Constitutional:      General: She is not in acute distress.    Appearance: Normal appearance. She is not ill-appearing.  Pulmonary:     Effort: Pulmonary effort is normal. No respiratory distress.  Neurological:     Mental Status: She is alert and oriented to person, place, and time.        Assessment & Plan     Problem List Items Addressed This Visit       Cardiovascular and Mediastinum   Essential (primary) hypertension - Primary   Chronic hypertension not well controlled. Previous visit on December 3rd showed elevated blood pressure, leading to an increase in amlodipine to 10 mg and benazepril to 20 mg. Current home readings with a new cuff show blood pressure of 149/92 mmHg and 154/88 mmHg. No side effects from tirzepatide and tolerating increased doses of amlodipine and benazepril. Discussed adding chlorthalidone to further manage blood pressure. Explained that chlorthalidone is a diuretic that helps reduce blood pressure by increasing urine output. Advised taking it in the morning to avoid nocturia. Emphasized achieving target blood pressure of 130/70 mmHg or lower to prevent complications. Potential to reduce medications if blood pressure stabilizes and remains controlled. -CHRONIC  - Continue amlodipine 10-benazepril 20 mg daily - Add chlorthalidone 25 mg daily in the morning - Schedule follow-up appointment on January 8th at 10:20 AM for blood pressure check - Log daily blood pressure readings and bring to next appointment      Relevant Medications    chlorthalidone (HYGROTON) 25 MG tablet     Other   Overweight (BMI 25.0-29.9)   Weight loss to 145.8 lbs from 154 lbs on December 3rd, likely contributing positively to blood pressure management. On tirzepatide (Zepbound) 2.5 mg with decreased appetite and aversion to greasy foods. Discussed importance of continued weight management and healthy eating habits. - chronic, improving  - Continue tirzepatide (Zepbound) 2.5 mg - Increase dosage as scheduled next Thursday to 5mg  weekly  - Encourage continued healthy eating habits and weight monitoring      Relevant Medications   tirzepatide 5 MG/0.5ML injection vial (Start on 12/17/2023)       General Health Maintenance Making lifestyle changes to support blood pressure management, including reducing intake of fried foods and incorporating protein shakes and frozen fruit into diet. - Encourage continued lifestyle modifications to support blood pressure and weight management -  Reinforce importance of a low-sodium diet.         No follow-ups on file.     I discussed the assessment and treatment plan with the patient. The patient was provided an opportunity to ask questions and all were answered. The patient agreed with the plan and demonstrated an understanding of the instructions.   The patient was advised to call back or seek an in-person evaluation if the symptoms worsen or if the condition fails to improve as anticipated.  I provided 15 minutes of non-face-to-face time during this encounter.   Ronnald Ramp, MD [facility] [phone] (phone) [phone] (fax)  [facility]

## 2023-12-10 NOTE — Telephone Encounter (Signed)
Visit was completed today

## 2023-12-10 NOTE — Progress Notes (Signed)
Duplicate encounter

## 2023-12-12 MED ORDER — TIRZEPATIDE-WEIGHT MANAGEMENT 5 MG/0.5ML ~~LOC~~ SOLN: 5.0000 mg | SUBCUTANEOUS | 2 refills | Status: DC

## 2023-12-12 MED ORDER — CHLORTHALIDONE 25 MG PO TABS: 25.0000 mg | ORAL_TABLET | Freq: Every day | ORAL | 1 refills | Status: DC

## 2023-12-12 NOTE — Assessment & Plan Note (Signed)
Weight loss to 145.8 lbs from 154 lbs on December 3rd, likely contributing positively to blood pressure management. On tirzepatide (Zepbound) 2.5 mg with decreased appetite and aversion to greasy foods. Discussed importance of continued weight management and healthy eating habits. - chronic, improving  - Continue tirzepatide (Zepbound) 2.5 mg - Increase dosage as scheduled next Thursday to 5mg  weekly  - Encourage continued healthy eating habits and weight monitoring

## 2023-12-12 NOTE — Assessment & Plan Note (Signed)
Chronic hypertension not well controlled. Previous visit on December 3rd showed elevated blood pressure, leading to an increase in amlodipine to 10 mg and benazepril to 20 mg. Current home readings with a new cuff show blood pressure of 149/92 mmHg and 154/88 mmHg. No side effects from tirzepatide and tolerating increased doses of amlodipine and benazepril. Discussed adding chlorthalidone to further manage blood pressure. Explained that chlorthalidone is a diuretic that helps reduce blood pressure by increasing urine output. Advised taking it in the morning to avoid nocturia. Emphasized achieving target blood pressure of 130/70 mmHg or lower to prevent complications. Potential to reduce medications if blood pressure stabilizes and remains controlled. -CHRONIC  - Continue amlodipine 10-benazepril 20 mg daily - Add chlorthalidone 25 mg daily in the morning - Schedule follow-up appointment on January 8th at 10:20 AM for blood pressure check - Log daily blood pressure readings and bring to next appointment

## 2023-12-22 ENCOUNTER — Encounter: Payer: Self-pay | Admitting: Family Medicine

## 2023-12-22 ENCOUNTER — Encounter: Payer: Self-pay | Admitting: Obstetrics and Gynecology

## 2023-12-23 ENCOUNTER — Encounter: Payer: Self-pay | Admitting: Emergency Medicine

## 2023-12-23 ENCOUNTER — Other Ambulatory Visit: Payer: Self-pay

## 2023-12-23 ENCOUNTER — Emergency Department
Admission: EM | Admit: 2023-12-23 | Discharge: 2023-12-23 | Disposition: A | Discharge status: Home / Self Care | Payer: 59 | Attending: Emergency Medicine | Admitting: Emergency Medicine

## 2023-12-23 DIAGNOSIS — E876 Hypokalemia: Secondary | ICD-10-CM | POA: Insufficient documentation

## 2023-12-23 DIAGNOSIS — I10 Essential (primary) hypertension: Secondary | ICD-10-CM | POA: Insufficient documentation

## 2023-12-23 DIAGNOSIS — R002 Palpitations: Secondary | ICD-10-CM

## 2023-12-23 LAB — CBC
HCT: 46.6 % — ABNORMAL HIGH (ref 36.0–46.0)
Hemoglobin: 16.6 g/dL — ABNORMAL HIGH (ref 12.0–15.0)
MCH: 30.2 pg (ref 26.0–34.0)
MCHC: 35.6 g/dL (ref 30.0–36.0)
MCV: 84.9 fL (ref 80.0–100.0)
Platelets: 511 10*3/uL — ABNORMAL HIGH (ref 150–400)
RBC: 5.49 MIL/uL — ABNORMAL HIGH (ref 3.87–5.11)
RDW: 12.2 % (ref 11.5–15.5)
WBC: 11.1 10*3/uL — ABNORMAL HIGH (ref 4.0–10.5)
nRBC: 0 % (ref 0.0–0.2)

## 2023-12-23 LAB — BASIC METABOLIC PANEL
Anion gap: 18 — ABNORMAL HIGH (ref 5–15)
BUN: 11 mg/dL (ref 6–20)
CO2: 18 mmol/L — ABNORMAL LOW (ref 22–32)
Calcium: 9.8 mg/dL (ref 8.9–10.3)
Chloride: 98 mmol/L (ref 98–111)
Creatinine, Ser: 1.11 mg/dL — ABNORMAL HIGH (ref 0.44–1.00)
GFR, Estimated: >60 mL/min (ref >60)
Glucose, Bld: 166 mg/dL — ABNORMAL HIGH (ref 70–99)
Potassium: 2.6 mmol/L — CL (ref 3.5–5.1)
Sodium: 134 mmol/L — ABNORMAL LOW (ref 135–145)

## 2023-12-23 LAB — URINALYSIS, ROUTINE W REFLEX MICROSCOPIC
Bilirubin Urine: NEGATIVE
Glucose, UA: NEGATIVE mg/dL
Hgb urine dipstick: NEGATIVE
Ketones, ur: 5 mg/dL — AB
Leukocytes,Ua: NEGATIVE
Nitrite: NEGATIVE
Protein, ur: 100 mg/dL — AB
Specific Gravity, Urine: 1.024 (ref 1.005–1.030)
pH: 5 (ref 5.0–8.0)

## 2023-12-23 LAB — TROPONIN I (HIGH SENSITIVITY)
Troponin I (High Sensitivity): 4 ng/L (ref <18)
Troponin I (High Sensitivity): 5 ng/L (ref <18)

## 2023-12-23 LAB — PREGNANCY, URINE: Preg Test, Ur: NEGATIVE

## 2023-12-23 LAB — MAGNESIUM: Magnesium: 2.4 mg/dL (ref 1.7–2.4)

## 2023-12-23 MED ORDER — ONDANSETRON HCL 4 MG/2ML IJ SOLN: 4.0000 mg | Freq: Once | INTRAMUSCULAR | Status: DC

## 2023-12-23 MED ORDER — SODIUM CHLORIDE 0.9 % IV BOLUS
1000.0000 mL | Freq: Once | INTRAVENOUS | Status: AC
  Administered 2023-12-23: 1000 mL via INTRAVENOUS

## 2023-12-23 MED ORDER — LORAZEPAM 2 MG/ML IJ SOLN: 1.0000 mg | Freq: Once | INTRAMUSCULAR | Status: DC

## 2023-12-23 MED ORDER — POTASSIUM CHLORIDE CRYS ER 20 MEQ PO TBCR
40.0000 meq | EXTENDED_RELEASE_TABLET | Freq: Once | ORAL | Status: AC
Start: 2023-12-23 — End: 2023-12-23
  Administered 2023-12-23: 40 meq via ORAL
  Filled 2023-12-23: qty 2

## 2023-12-23 MED ORDER — POTASSIUM CHLORIDE CRYS ER 20 MEQ PO TBCR
40.0000 meq | EXTENDED_RELEASE_TABLET | Freq: Once | ORAL | Status: AC
  Administered 2023-12-23: 40 meq via ORAL

## 2023-12-23 MED ORDER — ONDANSETRON 4 MG PO TBDP
4.0000 mg | ORAL_TABLET | Freq: Once | ORAL | Status: AC
  Administered 2023-12-23: 4 mg via ORAL
  Filled 2023-12-23: qty 1

## 2023-12-23 NOTE — ED Notes (Signed)
 Pt refused ativan as she wants to be able to drive home and does not feel comfortable taking a cab home. EDP wong informed pt refusing medication.

## 2023-12-23 NOTE — ED Provider Notes (Addendum)
 [facility] Provider Note    Event Date/Time   First MD Initiated Contact with Patient 12/23/23 0308     (approximate)   History   Hypertension and Palpitations   HPI  Anita Miller is a 48 y.o. female   Past medical history of epilepsy on antiepileptic, hypertension, hyperlipidemia, recently started on Zepbound  a couple weeks ago who presents to the emergency department with high blood pressure and palpitations.  She takes amlodipine  for high blood pressure and checks her blood pressure daily.  She was in her regular state of health and checked her blood pressure as she typically does this morning and noted that it was slightly higher than normal.  She rechecked it and it continue to be high.  At this time she became anxious and called her primary doctor and noted that she then developed palpitations and chest tightness and shortness of breath, nausea..  She came to the emergency department due to high blood pressure and the symptoms as above.  Currently she still feels that her heart is racing but denies any chest pain or shortness of breath.  She feels anxious.  She had THC product prior to coming to the emergency department.  She has been compliant with all medications.  With her new stepdown medication she has had poor p.o. intake due to no appetite.  External Medical Documents Reviewed: 12/10/2023 family medicine note documenting past medical history and medications including hypertension medications and weight loss medications, increased dosage to 5 mg this last week      Physical Exam   Triage Vital Signs: ED Triage Vitals  Encounter Vitals Group     BP 12/23/23 0046 (!) 122/101     Systolic BP Percentile --      Diastolic BP Percentile --      Pulse Rate 12/23/23 0046 (!) 131     Resp 12/23/23 0046 (!) 21     Temp 12/23/23 0046 97.9 F (36.6 C)     Temp Source 12/23/23 0241 Oral     SpO2 12/23/23 0046 99 %     Weight 12/23/23 0032  143 lb (64.9 kg)     Height 12/23/23 0032 5' 2 (1.575 m)     Head Circumference --      Peak Flow --      Pain Score 12/23/23 0031 0     Pain Loc --      Pain Education --      Exclude from Growth Chart --     Most recent vital signs: Vitals:   12/23/23 0645 12/23/23 0651  BP:    Pulse: (!) 130 (!) 123  Resp: 20 17  Temp:    SpO2: 97% 100%    General: Awake, no distress.  CV:  Good peripheral perfusion. Resp:  Normal effort.  Abd:  No distention.  Other:  Tachycardic 120s to 130s.  Clear lungs.  Awake alert oriented.  Nontoxic appearance.  Nontender abdomen.  Normotensive.   ED Results / Procedures / Treatments   Labs (all labs ordered are listed, but only abnormal results are displayed) Labs Reviewed  CBC - Abnormal; Notable for the following components:      Result Value   WBC 11.1 (*)    RBC 5.49 (*)    Hemoglobin 16.6 (*)    HCT 46.6 (*)    Platelets 511 (*)    All other components within normal limits  BASIC METABOLIC PANEL - Abnormal; Notable for the following components:  Sodium 134 (*)    Potassium 2.6 (*)    CO2 18 (*)    Glucose, Bld 166 (*)    Creatinine, Ser 1.11 (*)    Anion gap 18 (*)    All other components within normal limits  URINALYSIS, ROUTINE W REFLEX MICROSCOPIC - Abnormal; Notable for the following components:   Color, Urine AMBER (*)    APPearance CLOUDY (*)    Ketones, ur 5 (*)    Protein, ur 100 (*)    Bacteria, UA FEW (*)    All other components within normal limits  MAGNESIUM   PREGNANCY, URINE  TROPONIN I (HIGH SENSITIVITY)  TROPONIN I (HIGH SENSITIVITY)     I ordered and reviewed the above labs they are notable for hypokalemia 2.6.  EKG  ED ECG REPORT I, Ginnie Shams, the attending physician, personally viewed and interpreted this ECG.   Date: 12/23/2023  EKG Time: 0338  Rate: 137  Rhythm: Sinus tachycardia  Axis: nl  Intervals:nl  ST&T Change: no stemi   PROCEDURES:  Critical Care performed:  No  Procedures   MEDICATIONS ORDERED IN ED: Medications  LORazepam  (ATIVAN ) injection 1 mg (1 mg Intravenous Patient Refused/Not Given 12/23/23 0410)  ondansetron  (ZOFRAN -ODT) disintegrating tablet 4 mg (4 mg Oral Given 12/23/23 0245)  potassium chloride  SA (KLOR-CON  M) CR tablet 40 mEq (40 mEq Oral Given 12/23/23 0404)  potassium chloride  SA (KLOR-CON  M) CR tablet 40 mEq (40 mEq Oral Given 12/23/23 0350)  sodium chloride  0.9 % bolus 1,000 mL (0 mLs Intravenous Stopped 12/23/23 0552)    IMPRESSION / MDM / ASSESSMENT AND PLAN / ED COURSE  I reviewed the triage vital signs and the nursing notes.                                Patient's presentation is most consistent with acute presentation with potential threat to life or bodily function.  Differential diagnosis includes, but is not limited to, dysrhythmia, dehydration, electrolyte disturbance, dehydration, anxiety   The patient is on the cardiac monitor to evaluate for evidence of arrhythmia and/or significant heart rate changes.  MDM:    In terms of her high blood pressure, normotension now, will defer further management for primary doctor.  She has hypokalemia for which oral repletion was ordered.  Magnesium  is normal.  This may be in the setting of her Zepbound  and poor p.o. intake.  There is no hypokalemia related EKG changes and her QTc interval is normal.  She has sinus tachycardia that I think is related to either small amount of dehydration mixed with anxiety especially in the setting of THC use.  Will give IV fluid hydration, offered antianxiety medications but patient declined stating that she needs to drive home.  No ischemic changes and no chest pain I doubt ACS.  I doubt PE at this time as well.  Will reassess after fluids, and if remains stable with some improvement in heart rate plan will be for discharge home PMD follow-up.   --  Resting comfortably no symptoms. HR improved to 109 but back to 120s when standing/walking.  Offered ativan  for anxiety/more fluids but pt declines now stating wants to go home an rest and will hydrate there. I don't think the tachycardia is driven by any dysrthymia/infx/cardiopulmonary emergency. K was repleted and intervals normal. Plan for dc and pmd f/u      FINAL CLINICAL IMPRESSION(S) / ED DIAGNOSES   Final diagnoses:  Hypertension, unspecified type  Palpitations  Hypokalemia     Rx / DC Orders   ED Discharge Orders     None        Note:  This document was prepared using Dragon voice recognition software and may include unintentional dictation errors.    Cyrena Mylar, MD 12/23/23 LUCIE    Cyrena Mylar, MD 12/23/23 [phone]

## 2023-12-23 NOTE — ED Notes (Signed)
 Patient has now had 2 vomiting episodes that started after triage process. Medicated as needed.

## 2023-12-23 NOTE — Discharge Instructions (Addendum)
 Thank you for choosing us  for your health care today!  Drink plenty of fluids to stay well-hydrated.  Find Pedialyte or similar electrolyte rehydration formulas at your local pharmacy.  Please see your primary doctor this week for a follow up appointment.   If you have any new, worsening, or unexpected symptoms call your doctor right away or come back to the emergency department for reevaluation.  It was my pleasure to care for you today.   Ginnie EDISON Cyrena, MD

## 2023-12-23 NOTE — ED Triage Notes (Signed)
 Patient ambulatory to triage with complaints of elevated BP, states she sent a message to her doctor who told her to continue to monitor. She states later on tonight she started to become sweaty, short of breath, and felt like her heart was racing. BP at home 159/118.

## 2023-12-23 NOTE — ED Notes (Signed)
 Pts IVF completed HR remains 120s while pt is moving in bed. Pt ambulated to and from the bathroom with HR 130s. Pt denies CP/SOB. EDP [place] Charon informed. EDP to see pt

## 2023-12-23 NOTE — ED Notes (Signed)
 Confirmed with EDP pt to receive a total of 80 meq of PO K. Meds given see mar

## 2023-12-26 ENCOUNTER — Encounter: Payer: Self-pay | Admitting: Family Medicine

## 2023-12-28 ENCOUNTER — Other Ambulatory Visit: Payer: Self-pay | Admitting: Family Medicine

## 2023-12-28 MED ORDER — ONDANSETRON 4 MG PO TBDP: 4.0000 mg | ORAL_TABLET | Freq: Three times a day (TID) | ORAL | 0 refills | Status: DC | PRN

## 2023-12-30 ENCOUNTER — Ambulatory Visit: Payer: 59 | Admitting: Family Medicine

## 2023-12-30 ENCOUNTER — Encounter: Payer: Self-pay | Admitting: Family Medicine

## 2023-12-30 VITALS — BP 109/82 | HR 101 | Ht 62.0 in | Wt 139.0 lb

## 2023-12-30 DIAGNOSIS — E663 Overweight: Secondary | ICD-10-CM

## 2023-12-30 DIAGNOSIS — I10 Essential (primary) hypertension: Secondary | ICD-10-CM | POA: Diagnosis not present

## 2023-12-30 NOTE — Progress Notes (Signed)
 Established patient visit   Patient: Anita Miller   DOB: [birth year]   48 y.o. Female  MRN: [id number] Visit Date: 12/30/2023  Today's healthcare provider: Rockie Agent, MD   Chief Complaint  Patient presents with   Follow-up    HTN and lab   Subjective     HPI     Follow-up    Additional comments: HTN and lab      Last edited by Deitra Therisa HERO, CMA on 12/30/2023 10:29 AM.       Discussed the use of AI scribe software for clinical note transcription with the patient, who gave verbal consent to proceed.  History of Present Illness   The patient is a 48 year old individual with a history of hypertension, seizures, and recent weight loss. She presents for a follow-up visit after an emergency department (ED) evaluation for nodular vomiting. The patient's blood pressure has improved from 149/92 to 109/82 since the last evaluation. However, recent lab results showed elevated glucose at 166, low potassium at 2.6, and slightly elevated creatinine at 1.1.  The patient's current medications include amlodipine  10mg , benazepril  20mg , chlorthalidone  25mg  daily for hypertension, lamotrigine 100mg  for seizures, and Zofran  for nausea. She is also on tirzepatide  5mg  once weekly for weight loss. Since the last visit in December, the patient has lost weight, going from 145 pounds to 139 pounds.  The patient reported a recent episode of severe nausea and vomiting after changing the injection site of her tirzepatide  from the abdomen to the arm. She experienced every negative side effect possible from the medication when injected into the arm, but had no issues when injecting into the abdomen. The patient has decided to continue injecting the medication into the abdomen due to the severe side effects experienced when injecting into the arm.  The patient also reported a recent ED visit due to severe nausea and vomiting, during which her heart rate increased to 130 and at one point  jumped to 161. The patient was dehydrated during this visit, which may have contributed to the increased heart rate. Since the ED visit, the patient's heart rate has been slightly elevated, often in the low 100s.  The patient's blood pressure has been well-controlled with her current medication regimen. However, if her blood pressure consistently drops to the 90s over 70s or 60s, the patient has been advised to consider reducing her chlorthalidone  dosage.  The patient's potassium level was significantly low at 2.6 during the ED visit, possibly due to vomiting and not eating for a couple of days. The patient was given potassium supplements in the ED.         Past Medical History:  Diagnosis Date   Epilepsy (HCC)    Hyperlipidemia    Hypertension     Medications: Outpatient Medications Prior to Visit  Medication Sig   amLODipine -benazepril  (LOTREL) 10-20 MG capsule Take 1 capsule by mouth daily.   Bacillus Coagulans-Inulin (ALIGN PREBIOTIC-PROBIOTIC) 5-1.25 MG-GM CHEW Chew by mouth daily. Currently taking Olly Probiotic   chlorthalidone  (HYGROTON ) 25 MG tablet Take 1 tablet (25 mg total) by mouth daily.   lamoTRIgine (LAMICTAL) 100 MG tablet Take 100 mg by mouth daily.   medroxyPROGESTERone  (DEPO-PROVERA ) 150 MG/ML injection Inject 1 mL (150 mg total) into the muscle every 3 (three) months.   Multiple Vitamin (MULTIVITAMIN ADULT PO) Take 1 tablet by mouth daily. Hair, skin and nails formula   ondansetron  (ZOFRAN -ODT) 4 MG disintegrating tablet Take 1 tablet (4 mg total) by mouth  every 8 (eight) hours as needed for nausea or vomiting.   tirzepatide  5 MG/0.5ML injection vial Inject 5 mg into the skin once a week.   [DISCONTINUED] tirzepatide  (ZEPBOUND ) 2.5 MG/0.5ML Pen Inject 2.5 mg into the skin once a week.   No facility-administered medications prior to visit.    Review of Systems  Last metabolic panel Lab Results  Component Value Date   GLUCOSE 104 (H) 12/30/2023   NA 143  12/30/2023   K 3.3 (L) 12/30/2023   CL 101 12/30/2023   CO2 27 12/30/2023   BUN 8 12/30/2023   CREATININE 1.11 (H) 12/30/2023   GFRNONAA >60 12/23/2023   CALCIUM 9.9 12/30/2023   PROT 6.7 12/30/2023   ALBUMIN 4.4 12/30/2023   LABGLOB 2.3 12/30/2023   AGRATIO 1.8 05/14/2023   BILITOT 0.3 12/30/2023   ALKPHOS 81 12/30/2023   AST 17 12/30/2023   ALT 10 12/30/2023   ANIONGAP 18 (H) 12/23/2023   Last lipids Lab Results  Component Value Date   CHOL 216 (H) 05/14/2023   HDL 51 05/14/2023   LDLCALC 148 (H) 05/14/2023   TRIG 93 05/14/2023   CHOLHDL 4.2 05/14/2023   Last hemoglobin A1c Lab Results  Component Value Date   HGBA1C 5.6 05/14/2023        Objective    BP 109/82 (BP Location: Right Arm, Patient Position: Sitting, Cuff Size: Normal)   Pulse (!) 101   Ht 5' 2 (1.575 m)   Wt 139 lb (63 kg)   SpO2 97%   BMI 25.42 kg/m   BP Readings from Last 3 Encounters:  12/30/23 109/82  12/23/23 118/84  12/10/23 (!) 149/92   Wt Readings from Last 3 Encounters:  12/30/23 139 lb (63 kg)  12/23/23 143 lb (64.9 kg)  12/10/23 145 lb 12.8 oz (66.1 kg)        Physical Exam  General: Alert, no acute distress Cardio: Normal S1 and S2, RR. tachardia, no r/m/g Pulm: CTAB, normal work of breathing Abdomen: Bowel sounds normal. Abdomen soft and non-tender.    Results for orders placed or performed in visit on 12/30/23  CMP14+EGFR  Result Value Ref Range   Glucose 104 (H) 70 - 99 mg/dL   BUN 8 6 - 24 mg/dL   Creatinine, Ser 8.88 (H) 0.57 - 1.00 mg/dL   eGFR 62 >40 fO/fpw/8.26   BUN/Creatinine Ratio 7 (L) 9 - 23   Sodium 143 134 - 144 mmol/L   Potassium 3.3 (L) 3.5 - 5.2 mmol/L   Chloride 101 96 - 106 mmol/L   CO2 27 20 - 29 mmol/L   Calcium 9.9 8.7 - 10.2 mg/dL   Total Protein 6.7 6.0 - 8.5 g/dL   Albumin 4.4 3.9 - 4.9 g/dL   Globulin, Total 2.3 1.5 - 4.5 g/dL   Bilirubin Total 0.3 0.0 - 1.2 mg/dL   Alkaline Phosphatase 81 44 - 121 IU/L   AST 17 0 - 40 IU/L   ALT  10 0 - 32 IU/L    Assessment & Plan     Problem List Items Addressed This Visit       Cardiovascular and Mediastinum   Essential (primary) hypertension - Primary   Blood pressure is well-controlled at 109/82 today, improved from 149/92 at the last visit. Current medications include amlodipine  10 mg, benazepril  20 mg, and chlorthalidone  25 mg daily. Discussed the possibility of reducing chlorthalidone  if blood pressure consistently falls into the 90s/60s range. Patient has been monitoring blood pressure regularly and reports  consistent readings. Chronic, improved and within goal range systolic, mildly above goal of less than 80 for repeated diastolic measurement - Continue current antihypertensive medications - Monitor blood pressure regularly - Consider reducing chlorthalidone  if blood pressure consistently falls into the 90s/60s range      Relevant Orders   CMP14+EGFR (Completed)        Hypokalemia Potassium was critically low at 2.6 during the ED visit, likely due to decreased oral intake and possible effects of chlorthalidone . Potassium supplementation was given in the ED. Discussed the importance of regular intake of snacks to avoid long periods without eating, which can contribute to hypokalemia. - Order repeat metabolic panel to recheck potassium levels - Encourage regular intake of snacks to avoid long periods without eating   Tachycardia Heart rate was elevated during the ED visit, likely due to dehydration. Current heart rate is improving but still slightly elevated. Discussed the importance of adequate hydration and the potential need to adjust medication if tachycardia persists. - Monitor heart rate regularly - Increase daily water intake to at least 70 ounces  Nausea and Vomiting Nausea and vomiting have resolved since the last episode. Symptoms were likely related to the administration site of tirzepatide  injection. Patient reports no issues when injecting in the  abdomen but experienced severe side effects when injecting in the arm. Discussed the importance of proper injection site to avoid adverse effects. - Continue tirzepatide  injections in the abdomen - Avoid injecting tirzepatide  in the arm   General Health Maintenance Discussed the importance of maintaining hydration and regular eating habits to prevent electrolyte imbalances and other complications. - Encourage regular intake of snacks and meals - Increase daily water intake to at least 70 ounces    Return in about 3 months (around 03/29/2024) for Weight MGMT, HTN.         Rockie Agent, MD  [facility] [phone] (phone) [phone] (fax)  [facility]

## 2023-12-31 ENCOUNTER — Ambulatory Visit: Payer: BC Managed Care – PPO

## 2023-12-31 LAB — CMP14+EGFR
ALT: 10 [IU]/L (ref 0–32)
AST: 17 [IU]/L (ref 0–40)
Albumin: 4.4 g/dL (ref 3.9–4.9)
Alkaline Phosphatase: 81 [IU]/L (ref 44–121)
BUN/Creatinine Ratio: 7 — ABNORMAL LOW (ref 9–23)
BUN: 8 mg/dL (ref 6–24)
Bilirubin Total: 0.3 mg/dL (ref 0.0–1.2)
CO2: 27 mmol/L (ref 20–29)
Calcium: 9.9 mg/dL (ref 8.7–10.2)
Chloride: 101 mmol/L (ref 96–106)
Creatinine, Ser: 1.11 mg/dL — ABNORMAL HIGH (ref 0.57–1.00)
Globulin, Total: 2.3 g/dL (ref 1.5–4.5)
Glucose: 104 mg/dL — ABNORMAL HIGH (ref 70–99)
Potassium: 3.3 mmol/L — ABNORMAL LOW (ref 3.5–5.2)
Sodium: 143 mmol/L (ref 134–144)
Total Protein: 6.7 g/dL (ref 6.0–8.5)
eGFR: 62 mL/min/{1.73_m2} (ref >59)

## 2023-12-31 NOTE — Assessment & Plan Note (Signed)
 Patient has lost weight from 145 lbs to 139 lbs since December. BMI is now 25.42, approaching the normal range. Tirzepatide  is being used for weight loss. Discussed the benefits of continued weight loss and the importance of monitoring for any adverse effects. - Continue tirzepatide  5 mg once weekly - Monitor weight and BMI regularly

## 2023-12-31 NOTE — Assessment & Plan Note (Signed)
 Blood pressure is well-controlled at 109/82 today, improved from 149/92 at the last visit. Current medications include amlodipine  10 mg, benazepril  20 mg, and chlorthalidone  25 mg daily. Discussed the possibility of reducing chlorthalidone  if blood pressure consistently falls into the 90s/60s range. Patient has been monitoring blood pressure regularly and reports consistent readings. Chronic, improved and within goal range systolic, mildly above goal of less than 80 for repeated diastolic measurement - Continue current antihypertensive medications - Monitor blood pressure regularly - Consider reducing chlorthalidone  if blood pressure consistently falls into the 90s/60s range

## 2024-01-01 ENCOUNTER — Other Ambulatory Visit: Payer: Self-pay | Admitting: Family Medicine

## 2024-01-01 ENCOUNTER — Ambulatory Visit: Payer: BC Managed Care – PPO

## 2024-01-01 ENCOUNTER — Ambulatory Visit (INDEPENDENT_AMBULATORY_CARE_PROVIDER_SITE_OTHER): Payer: 59

## 2024-01-01 VITALS — BP 109/73 | HR 98 | Wt 140.4 lb

## 2024-01-01 DIAGNOSIS — Z3042 Encounter for surveillance of injectable contraceptive: Secondary | ICD-10-CM | POA: Diagnosis not present

## 2024-01-01 DIAGNOSIS — E876 Hypokalemia: Secondary | ICD-10-CM

## 2024-01-01 MED ORDER — MEDROXYPROGESTERONE ACETATE 150 MG/ML IM SUSY
150.0000 mg | PREFILLED_SYRINGE | Freq: Once | INTRAMUSCULAR | Status: AC
  Administered 2024-01-01: 150 mg via INTRAMUSCULAR

## 2024-01-01 MED ORDER — POTASSIUM CHLORIDE CRYS ER 20 MEQ PO TBCR: 20.0000 meq | EXTENDED_RELEASE_TABLET | Freq: Every day | ORAL | 0 refills | Status: DC

## 2024-01-01 NOTE — Progress Notes (Signed)
    NURSE VISIT NOTE  Subjective:    Patient ID: Anita Miller, female    DOB: 12/03/1976, 48 y.o.   MRN: [id number]  HPI  Patient is a 48 y.o. G17P2002 female who presents for depo provera  injection.   Objective:    BP 109/73 (BP Location: Left Arm, Patient Position: Sitting, Cuff Size: Normal)   Pulse 98   Wt 140 lb 6.4 oz (63.7 kg)   BMI 25.68 kg/m   Last Annual: 01/09/2023. Last pap: 01/09/2023. Last Depo-Provera : 10/08/2023. Side Effects if any: none. Serum HCG indicated? No . Depo-Provera  150 mg IM given by: Burnard Ro, CMA. Site: Left [place]  Lab Review  @THIS  VISIT ONLY@  Assessment:   1. Encounter for surveillance of injectable contraceptive      Plan:   Next appointment due between March 28th and April 11th.    Burnard LITTIE Ro, CMA

## 2024-01-02 ENCOUNTER — Other Ambulatory Visit: Payer: Self-pay | Admitting: Family Medicine

## 2024-01-02 DIAGNOSIS — E876 Hypokalemia: Secondary | ICD-10-CM

## 2024-01-12 ENCOUNTER — Ambulatory Visit: Payer: BC Managed Care – PPO | Admitting: Dermatology

## 2024-01-13 ENCOUNTER — Ambulatory Visit: Payer: BC Managed Care – PPO | Admitting: Dermatology

## 2024-01-13 ENCOUNTER — Encounter: Payer: Self-pay | Admitting: *Deleted

## 2024-01-29 NOTE — Patient Instructions (Addendum)
Preventive Care 39-47 Years Old, Female Preventive care refers to lifestyle choices and visits with your health care provider that can promote health and wellness. Preventive care visits are also called wellness exams. What can I expect for my preventive care visit? Counseling Your health care provider may ask you questions about your: Medical history, including: Past medical problems. Family medical history. Pregnancy history. Current health, including: Menstrual cycle. Method of birth control. Emotional well-being. Home life and relationship well-being. Sexual activity and sexual health. Lifestyle, including: Alcohol, nicotine or tobacco, and drug use. Access to firearms. Diet, exercise, and sleep habits. Work and work Astronomer. Sunscreen use. Safety issues such as seatbelt and bike helmet use. Physical exam Your health care provider will check your: Height and weight. These may be used to calculate your BMI (body mass index). BMI is a measurement that tells if you are at a healthy weight. Waist circumference. This measures the distance around your waistline. This measurement also tells if you are at a healthy weight and may help predict your risk of certain diseases, such as type 2 diabetes and high blood pressure. Heart rate and blood pressure. Body temperature. Skin for abnormal spots. What immunizations do I need?  Vaccines are usually given at various ages, according to a schedule. Your health care provider will recommend vaccines for you based on your age, medical history, and lifestyle or other factors, such as travel or where you work. What tests do I need? Screening Your health care provider may recommend screening tests for certain conditions. This may include: Lipid and cholesterol levels. Diabetes screening. This is done by checking your blood sugar (glucose) after you have not eaten for a while (fasting). Pelvic exam and Pap test. Hepatitis B test. Hepatitis C  test. HIV (human immunodeficiency virus) test. STI (sexually transmitted infection) testing, if you are at risk. Lung cancer screening. Colorectal cancer screening. Mammogram. Talk with your health care provider about when you should start having regular mammograms. This may depend on whether you have a family history of breast cancer. BRCA-related cancer screening. This may be done if you have a family history of breast, ovarian, tubal, or peritoneal cancers. Bone density scan. This is done to screen for osteoporosis. Talk with your health care provider about your test results, treatment options, and if necessary, the need for more tests. Follow these instructions at home: Eating and drinking  Eat a diet that includes fresh fruits and vegetables, whole grains, lean protein, and low-fat dairy products. Take vitamin and mineral supplements as recommended by your health care provider. Do not drink alcohol if: Your health care provider tells you not to drink. You are pregnant, may be pregnant, or are planning to become pregnant. If you drink alcohol: Limit how much you have to 0-1 drink a day. Know how much alcohol is in your drink. In the [place] one drink equals one 12 oz bottle of beer (355 mL), one 5 oz glass of wine (148 mL), or one 1 oz glass of hard liquor (44 mL). Lifestyle Brush your teeth every morning and night with fluoride toothpaste. Floss one time each day. Exercise for at least 30 minutes 5 or more days each week. Do not use any products that contain nicotine or tobacco. These products include cigarettes, chewing tobacco, and vaping devices, such as e-cigarettes. If you need help quitting, ask your health care provider. Do not use drugs. If you are sexually active, practice safe sex. Use a condom or other form of protection to  prevent STIs. If you do not wish to become pregnant, use a form of birth control. If you plan to become pregnant, see your health care provider for a  prepregnancy visit. Take aspirin only as told by your health care provider. Make sure that you understand how much to take and what form to take. Work with your health care provider to find out whether it is safe and beneficial for you to take aspirin daily. Find healthy ways to manage stress, such as: Meditation, yoga, or listening to music. Journaling. Talking to a trusted person. Spending time with friends and family. Minimize exposure to UV radiation to reduce your risk of skin cancer. Safety Always wear your seat belt while driving or riding in a vehicle. Do not drive: If you have been drinking alcohol. Do not ride with someone who has been drinking. When you are tired or distracted. While texting. If you have been using any mind-altering substances or drugs. Wear a helmet and other protective equipment during sports activities. If you have firearms in your house, make sure you follow all gun safety procedures. Seek help if you have been physically or sexually abused. What's next? Visit your health care provider once a year for an annual wellness visit. Ask your health care provider how often you should have your eyes and teeth checked. Stay up to date on all vaccines. This information is not intended to replace advice given to you by your health care provider. Make sure you discuss any questions you have with your health care provider. Document Revised: 06/05/2021 Document Reviewed: 06/05/2021 Elsevier Patient Education  2024 [employer] Breast Self-Awareness Breast self-awareness is knowing how your breasts look and feel. You need to: Check your breasts on a regular basis. Tell your doctor about any changes. Become familiar with the look and feel of your breasts. This can help you catch a breast problem while it is still small and can be treated. You should do breast self-exams even if you have breast implants. What you need: A mirror. A well-lit room. A pillow or other  soft object. How to do a breast self-exam Follow these steps to do a breast self-exam: Look for changes  Take off all the clothes above your waist. Stand in front of a mirror in a room with good lighting. Put your hands down at your sides. Compare your breasts in the mirror. Look for any difference between them, such as: A difference in shape. A difference in size. Wrinkles, dips, and bumps in one breast and not the other. Look at each breast for changes in the skin, such as: Redness. Scaly areas. Skin that has gotten thicker. Dimpling. Open sores (ulcers). Look for changes in your nipples, such as: Fluid coming out of a nipple. Fluid around a nipple. Bleeding. Dimpling. Redness. A nipple that looks pushed in (retracted), or that has changed position. Feel for changes Lie on your back. Feel each breast. To do this: Pick a breast to feel. Place a pillow under the shoulder closest to that breast. Put the arm closest to that breast behind your head. Feel the nipple area of that breast using the hand of your other arm. Feel the area with the pads of your three middle fingers by making small circles with your fingers. Use light, medium, and firm pressure. Continue the overlapping circles, moving downward over the breast. Keep making circles with your fingers. Stop when you feel your ribs. Start making circles with your fingers again, this time going  upward until you reach your collarbone. Then, make circles outward across your breast and into your armpit area. Squeeze your nipple. Check for discharge and lumps. Repeat these steps to check your other breast. Sit or stand in the tub or shower. With soapy water on your skin, feel each breast the same way you did when you were lying down. Write down what you find Writing down what you find can help you remember what to tell your doctor. Write down: What is normal for each breast. Any changes you find in each breast. These  include: The kind of changes you find. A tender or painful breast. Any lump you find. Write down its size and where it is. When you last had your monthly period (menstrual cycle). General tips If you are breastfeeding, the best time to check your breasts is after you feed your baby or after you use a breast pump. If you get monthly bleeding, the best time to check your breasts is 5-7 days after your monthly cycle ends. With time, you will become comfortable with the self-exam. You will also start to know if there are changes in your breasts. Contact a doctor if: You see a change in the shape or size of your breasts or nipples. You see a change in the skin of your breast or nipples, such as red or scaly skin. You have fluid coming from your nipples that is not normal. You find a new lump or thick area. You have breast pain. You have any concerns about your breast health. Summary Breast self-awareness includes looking for changes in your breasts and feeling for changes within your breasts. You should do breast self-awareness in front of a mirror in a well-lit room. If you get monthly periods (menstrual cycles), the best time to check your breasts is 5-7 days after your period ends. Tell your doctor about any changes you see in your breasts. Changes include changes in size, changes on the skin, painful or tender breasts, or fluid from your nipples that is not normal. This information is not intended to replace advice given to you by your health care provider. Make sure you discuss any questions you have with your health care provider. Document Revised: 05/15/2022 Document Reviewed: 10/10/2021 Elsevier Patient Education  2024 [employer].

## 2024-01-29 NOTE — Progress Notes (Signed)
GYNECOLOGY ANNUAL PHYSICAL EXAM PROGRESS NOTE  Subjective:    Anita Miller is a 48 y.o. G4P2002 female who presents for an annual exam.  The patient is sexually active. The patient participates in regular exercise: no. Has the patient ever been transfused or tattooed?: yes. The patient reports that there is not domestic violence in her life.   The patient has the following concerns today: Has questions regarding contraception. Has concerns about politics and its future affects on contraception access. Is currently on Depo Provera (has ben on this for 2 years) and is happy with it but knows she can't remain on it long-term. Has questions regarding tubal ligation.   Menstrual History: Menarche age: 19 No LMP recorded. Patient has had an injection.   Gynecologic History:  Contraception: Depo-Provera injections History of STI's: Denies Last Pap: 01/09/2023. Results were: abnormal. Notes h/o abnormal pap smears x 1 in remote past Last mammogram: 01/03/2023. Results were: normal   OB History  Gravida Para Term Preterm AB Living  2 2 2  0 0 2  SAB IAB Ectopic Multiple Live Births  0 0 0 0 2    # Outcome Date GA Lbr Len/2nd Weight Sex Type Anes PTL Lv  2 Term 02/14/06    M Vag-Spont   LIV  1 Term 09/30/00    F Vag-Spont   LIV    Past Medical History:  Diagnosis Date   Epilepsy (HCC)    Hyperlipidemia    Hypertension     Past Surgical History:  Procedure Laterality Date   COSMETIC SURGERY  08/01/2020   WISDOM TOOTH EXTRACTION Bilateral 2012    Family History  Problem Relation Age of Onset   Healthy Mother    Healthy Father    Asthma Daughter    Seizures Son    Breast cancer Neg Hx     Social History   Socioeconomic History   Marital status: Single    Spouse name: Not on file   Number of children: 2   Years of education: 18   Highest education level: Master's degree (e.g., MA, MS, MEng, MEd, MSW, MBA)  Occupational History   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)   Social Drivers of Health   Financial Resource Strain: Low Risk  (07/15/2022)   Overall Financial Resource Strain (CARDIA)    Difficulty of Paying Living Expenses: Not hard at all  Food Insecurity: No Food Insecurity (07/15/2022)   Hunger Vital Sign    Worried About Running Out of Food in the Last Year: Never true    Ran Out of Food in the Last Year: Never true  Transportation Needs: No Transportation Needs (07/15/2022)   PRAPARE - Administrator, Civil Service (Medical): No    Lack of Transportation (Non-Medical): No  Physical Activity: Unknown (12/08/2018)   Exercise Vital Sign    Days of Exercise per Week: Patient declined    Minutes of Exercise per Session: Patient declined  Stress: Unknown (12/08/2018)   [employer]  of Occupational Health - Occupational Stress Questionnaire    Feeling of Stress : Patient declined  Social Connections: Unknown (12/08/2018)   Social Connection and Isolation Panel [NHANES]    Frequency of Communication with Friends and Family: Patient declined    Frequency of Social Gatherings with Friends and Family: Patient declined    Attends Religious Services: Patient declined    Database administrator or Organizations: Patient declined    Attends Banker Meetings: Patient declined    Marital Status: Patient declined  Intimate Partner Violence: Not At Risk (07/15/2022)   Humiliation, Afraid, Rape, and Kick questionnaire    Fear of Current or Ex-Partner: No    Emotionally Abused: No    Physically Abused: No    Sexually  Abused: No    Current Outpatient Medications on File Prior to Visit  Medication Sig Dispense Refill   amLODipine-benazepril (LOTREL) 10-20 MG capsule Take 1 capsule by mouth daily. 30 capsule 5   Bacillus Coagulans-Inulin (ALIGN PREBIOTIC-PROBIOTIC) 5-1.25 MG-GM CHEW Chew by mouth daily. Currently taking Olly Probiotic     chlorthalidone (HYGROTON) 25 MG tablet Take 1 tablet (25 mg total) by mouth daily. 90 tablet 1   lamoTRIgine (LAMICTAL) 100 MG tablet Take 100 mg by mouth daily.     medroxyPROGESTERone (DEPO-PROVERA) 150 MG/ML injection Inject 1 mL (150 mg total) into the muscle every 3 (three) months. 1 mL 3   Multiple Vitamin (MULTIVITAMIN ADULT PO) Take 1 tablet by mouth daily. Hair, skin and nails formula     ondansetron (ZOFRAN-ODT) 4 MG disintegrating tablet Take 1 tablet (4 mg total) by mouth every 8 (eight) hours as needed for nausea or vomiting. 20 tablet 0   potassium chloride SA (KLOR-CON M) 20 MEQ tablet Take 1 tablet (20 mEq total) by mouth daily. 30 tablet 0   tirzepatide 5 MG/0.5ML injection vial Inject 5 mg into the skin once a week. 2 mL 2   No current facility-administered medications on file prior to visit.    No Known Allergies   Review of Systems Constitutional: negative for chills, fatigue, fevers and sweats Eyes: negative for irritation, redness and visual disturbance Ears, nose, mouth, throat, and face: negative for hearing loss, nasal congestion, snoring and tinnitus Respiratory: negative for asthma, cough, sputum Cardiovascular: negative for chest pain, dyspnea, exertional chest pressure/discomfort, irregular heart beat, palpitations and syncope Gastrointestinal: negative for abdominal pain, change in bowel habits, nausea and vomiting Genitourinary: negative for abnormal menstrual periods, genital lesions, sexual problems and vaginal discharge, dysuria and urinary incontinence Integument/breast: negative for breast lump, breast tenderness and nipple  discharge Hematologic/lymphatic: negative for bleeding and easy bruising Musculoskeletal:negative for back pain and muscle weakness Neurological: negative for dizziness, headaches, vertigo and weakness Endocrine: negative for diabetic symptoms including polydipsia, polyuria and skin dryness Allergic/Immunologic: negative for hay fever and urticaria      Objective:  Blood pressure 118/82, pulse 91, resp. rate 16, height 5\' 2"  (1.575 m), weight 137 lb 6.4 oz (62.3 kg).  Body mass index is 25.13 kg/m.   General Appearance:    Alert, cooperative, no distress, appears stated age  Head:    Normocephalic, without obvious abnormality, atraumatic  Eyes:    PERRL, conjunctiva/corneas clear, EOM's intact, both eyes  Ears:    Normal external ear canals, both ears  Nose:   Nares normal, septum midline, mucosa normal, no drainage or sinus tenderness  Throat:   Lips, mucosa, and tongue normal; teeth and gums normal  Neck:   Supple,  symmetrical, trachea midline, no adenopathy; thyroid: no enlargement/tenderness/nodules; no carotid bruit or JVD  Back:     Symmetric, no curvature, ROM normal, no CVA tenderness  Lungs:     Clear to auscultation bilaterally, respirations unlabored  Chest Wall:    No tenderness or deformity   Heart:    Regular rate and rhythm, S1 and S2 normal, no murmur, rub or gallop  Breast Exam:    No tenderness, masses, or nipple abnormality  Abdomen:     Soft, non-tender, bowel sounds active all four quadrants, no masses, no organomegaly.    Genitalia:    Pelvic:external genitalia normal, vagina without lesions, discharge, or tenderness, rectovaginal septum  normal. Cervix normal in appearance, no cervical motion tenderness, no adnexal masses or tenderness.  Uterus normal size, shape, mobile, regular contours, nontender.  Rectal:    Normal external sphincter.  No hemorrhoids appreciated. Internal exam not done.   Extremities:   Extremities normal, atraumatic, no cyanosis or edema   Pulses:   2+ and symmetric all extremities  Skin:   Skin color, texture, turgor normal, no rashes or lesions  Lymph nodes:   Cervical, supraclavicular, and axillary nodes normal  Neurologic:   CNII-XII intact, normal strength, sensation and reflexes throughout   .  Labs:  Lab Results  Component Value Date   WBC 11.1 (H) 12/23/2023   HGB 16.6 (H) 12/23/2023   HCT 46.6 (H) 12/23/2023   MCV 84.9 12/23/2023   PLT 511 (H) 12/23/2023    Lab Results  Component Value Date   CREATININE 1.11 (H) 12/30/2023   BUN 8 12/30/2023   NA 143 12/30/2023   K 3.3 (L) 12/30/2023   CL 101 12/30/2023   CO2 27 12/30/2023    Lab Results  Component Value Date   ALT 10 12/30/2023   AST 17 12/30/2023   ALKPHOS 81 12/30/2023   BILITOT 0.3 12/30/2023    Lab Results  Component Value Date   TSH 1.610 05/14/2023     Assessment:   1. Encounter for well woman exam with routine gynecological exam   2. Encounter for screening mammogram for malignant neoplasm of breast   3. Colon cancer screening   4. Encounter for surveillance of injectable contraceptive   5. Essential (primary) hypertension      Plan:  - Blood tests: UTD. - Breast self exam technique reviewed and patient encouraged to perform self-exam monthly. - Contraception: Depo-Provera injections. Discussed other LARC options, patient declines. Would like to continue Depo provera for now but desires more info on BTl. Included info in AVS.  - Discussed healthy lifestyle modifications. - Mammogram ordered - Colon screening:  Offered all options for screening. Has done Cologuard in the past, would like to do again.  Cologuard ordered.  - Pap smear  UTD . Due in 2027.  - Flu vaccine: Declined - Essential HTN, managed by PCP.   Follow up in 1 year for annual exam   Hildred Laser, MD [facility]

## 2024-01-31 ENCOUNTER — Other Ambulatory Visit: Payer: Self-pay | Admitting: Family Medicine

## 2024-01-31 DIAGNOSIS — E876 Hypokalemia: Secondary | ICD-10-CM

## 2024-02-02 ENCOUNTER — Encounter: Payer: Self-pay | Admitting: Obstetrics and Gynecology

## 2024-02-02 ENCOUNTER — Ambulatory Visit (INDEPENDENT_AMBULATORY_CARE_PROVIDER_SITE_OTHER): Payer: 59 | Admitting: Obstetrics and Gynecology

## 2024-02-02 VITALS — BP 118/82 | HR 91 | Resp 16 | Ht 62.0 in | Wt 137.4 lb

## 2024-02-02 DIAGNOSIS — Z1211 Encounter for screening for malignant neoplasm of colon: Secondary | ICD-10-CM

## 2024-02-02 DIAGNOSIS — Z01419 Encounter for gynecological examination (general) (routine) without abnormal findings: Secondary | ICD-10-CM | POA: Diagnosis not present

## 2024-02-02 DIAGNOSIS — Z1231 Encounter for screening mammogram for malignant neoplasm of breast: Secondary | ICD-10-CM

## 2024-02-02 DIAGNOSIS — I10 Essential (primary) hypertension: Secondary | ICD-10-CM

## 2024-02-02 DIAGNOSIS — Z3042 Encounter for surveillance of injectable contraceptive: Secondary | ICD-10-CM

## 2024-02-02 NOTE — Telephone Encounter (Signed)
Requested Prescriptions  Pending Prescriptions Disp Refills   potassium chloride SA (KLOR-CON M) 20 MEQ tablet [Pharmacy Med Name: POTASSIUM CL ER TABLETS] 90 tablet 0    Sig: TAKE 1 TABLET(20 MEQ) BY MOUTH DAILY     Endocrinology:  Minerals - Potassium Supplementation Failed - 02/02/2024  8:24 AM      Failed - K in normal range and within 360 days    Potassium  Date Value Ref Range Status  12/30/2023 3.3 (L) 3.5 - 5.2 mmol/L Final         Failed - Cr in normal range and within 360 days    Creatinine, Ser  Date Value Ref Range Status  12/30/2023 1.11 (H) 0.57 - 1.00 mg/dL Final         Passed - Valid encounter within last 12 months    Recent Outpatient Visits           1 month ago Essential (primary) hypertension   [facility] Simmons-Robinson, [place], MD   1 month ago Essential (primary) hypertension   [facility] Simmons-Robinson, [place], MD   2 months ago Essential (primary) hypertension   [facility] Simmons-Robinson, [place], MD   8 months ago Hyperhidrosis of axilla   [facility] Simmons-Robinson, [place], MD   1 year ago Annual physical exam   [facility], Ocie Bob, MD       Future Appointments             In 1 month Simmons-Robinson, Tawanna Cooler, MD [facility]

## 2024-03-25 ENCOUNTER — Ambulatory Visit: Payer: 59

## 2024-03-25 VITALS — BP 112/74 | HR 92 | Ht 62.0 in | Wt 124.0 lb

## 2024-03-25 DIAGNOSIS — Z3042 Encounter for surveillance of injectable contraceptive: Secondary | ICD-10-CM

## 2024-03-25 MED ORDER — MEDROXYPROGESTERONE ACETATE 150 MG/ML IM SUSY
150.0000 mg | PREFILLED_SYRINGE | Freq: Once | INTRAMUSCULAR | Status: AC
Start: 2024-03-25 — End: 2024-03-25
  Administered 2024-03-25: 150 mg via INTRAMUSCULAR

## 2024-03-25 NOTE — Progress Notes (Signed)
    NURSE VISIT NOTE  Subjective:    Patient ID: Anita Miller, female    DOB: 06-Apr-1976, 48 y.o.   MRN: [id number]  HPI  Patient is a 48 y.o. G49P2002 female who presents for depo provera injection.   Objective:    BP 112/74   Pulse 92   Ht 5\' 2"  (1.575 m)   Wt 124 lb (56.2 kg)   BMI 22.68 kg/m   Last Annual: 02/02/24 Last pap: 01/09/23. Last Depo-Provera: 01/01/24 Side Effects if any: none. Serum HCG indicated? No . Depo-Provera 150 mg IM given by: Beverely Pace, CMA. Site: Right Ventrogluteal    Assessment:   1. Encounter for Depo-Provera contraception      Plan:   Next appointment due between Jun20-July4     Loney Laurence, CMA

## 2024-03-29 ENCOUNTER — Ambulatory Visit: Payer: Self-pay | Admitting: Family Medicine

## 2024-03-29 NOTE — Progress Notes (Deleted)
 Established patient visit   Patient: Anita Miller   DOB: 22-Jun-1976   48 y.o. Female  MRN: [id number] Visit Date: 03/29/2024  Today's healthcare provider: Ronnald Ramp, MD   No chief complaint on file.  Subjective       Discussed the use of AI scribe software for clinical note transcription with the patient, who gave verbal consent to proceed.  History of Present Illness      Past Medical History:  Diagnosis Date   Epilepsy (HCC)    Hyperlipidemia    Hypertension     Medications: Outpatient Medications Prior to Visit  Medication Sig   amLODipine-benazepril (LOTREL) 10-20 MG capsule Take 1 capsule by mouth daily.   Bacillus Coagulans-Inulin (ALIGN PREBIOTIC-PROBIOTIC) 5-1.25 MG-GM CHEW Chew by mouth daily. Currently taking Olly Probiotic   chlorthalidone (HYGROTON) 25 MG tablet Take 1 tablet (25 mg total) by mouth daily.   lamoTRIgine (LAMICTAL) 100 MG tablet Take 100 mg by mouth daily.   medroxyPROGESTERone (DEPO-PROVERA) 150 MG/ML injection Inject 1 mL (150 mg total) into the muscle every 3 (three) months.   Multiple Vitamin (MULTIVITAMIN ADULT PO) Take 1 tablet by mouth daily. Hair, skin and nails formula   ondansetron (ZOFRAN-ODT) 4 MG disintegrating tablet Take 1 tablet (4 mg total) by mouth every 8 (eight) hours as needed for nausea or vomiting.   potassium chloride SA (KLOR-CON M) 20 MEQ tablet TAKE 1 TABLET(20 MEQ) BY MOUTH DAILY   tirzepatide 5 MG/0.5ML injection vial Inject 5 mg into the skin once a week.   No facility-administered medications prior to visit.    Review of Systems  Last metabolic panel Lab Results  Component Value Date   GLUCOSE 104 (H) 12/30/2023   NA 143 12/30/2023   K 3.3 (L) 12/30/2023   CL 101 12/30/2023   CO2 27 12/30/2023   BUN 8 12/30/2023   CREATININE 1.11 (H) 12/30/2023   EGFR 62 12/30/2023   CALCIUM 9.9 12/30/2023   PROT 6.7 12/30/2023   ALBUMIN 4.4 12/30/2023   LABGLOB 2.3 12/30/2023   AGRATIO  1.8 05/14/2023   BILITOT 0.3 12/30/2023   ALKPHOS 81 12/30/2023   AST 17 12/30/2023   ALT 10 12/30/2023   ANIONGAP 18 (H) 12/23/2023   Last lipids Lab Results  Component Value Date   CHOL 216 (H) 05/14/2023   HDL 51 05/14/2023   LDLCALC 148 (H) 05/14/2023   TRIG 93 05/14/2023   CHOLHDL 4.2 05/14/2023  The 10-year ASCVD risk score (Arnett DK, et al., 2019) is: 1.8%   Last hemoglobin A1c Lab Results  Component Value Date   HGBA1C 5.6 05/14/2023   Last thyroid functions Lab Results  Component Value Date   TSH 1.610 05/14/2023     {See past labs  Heme  Chem  Endocrine  Serology  Results Review (optional):1}   Objective    There were no vitals taken for this visit. BP Readings from Last 3 Encounters:  03/25/24 112/74  02/02/24 118/82  01/01/24 109/73   Wt Readings from Last 3 Encounters:  03/25/24 124 lb (56.2 kg)  02/02/24 137 lb 6.4 oz (62.3 kg)  01/01/24 140 lb 6.4 oz (63.7 kg)    {See vitals history (optional):1}    Physical Exam  ***  No results found for any visits on 03/29/24.  Assessment & Plan     Problem List Items Addressed This Visit   None    Assessment & Plan      No follow-ups on file.  Ronnald Ramp, MD  [facility] [phone] (phone) [phone] (fax)  [facility]

## 2024-05-04 ENCOUNTER — Ambulatory Visit: Admitting: Family Medicine

## 2024-05-04 ENCOUNTER — Encounter: Payer: Self-pay | Admitting: Family Medicine

## 2024-05-04 VITALS — BP 110/75 | HR 94 | Ht 62.0 in | Wt 127.0 lb

## 2024-05-04 DIAGNOSIS — Z1231 Encounter for screening mammogram for malignant neoplasm of breast: Secondary | ICD-10-CM

## 2024-05-04 DIAGNOSIS — I10 Essential (primary) hypertension: Secondary | ICD-10-CM | POA: Diagnosis not present

## 2024-05-04 NOTE — Patient Instructions (Signed)
 [facility] [address] [place],  [place]  [address] Main: [phone]

## 2024-05-04 NOTE — Assessment & Plan Note (Signed)
 Chronic hypertension, well-controlled with current medication regimen. Blood pressure is 110/75 mmHg. No recent episodes of hypotension or extreme blood pressure readings. No symptoms of dizziness or lightheadedness. Chlorthalidone  discontinued due to well-controlled blood pressure and potential impact on potassium levels. - Continue amlodipine  10 mg daily - Continue benazepril  20 mg daily - Discontinue chlorthalidone  - Monitor blood pressure regularly at home

## 2024-05-04 NOTE — Progress Notes (Signed)
 Established patient visit   Patient: Anita Miller   DOB: [birth year]   48 y.o. Female  MRN: [id number] Visit Date: 05/04/2024  Today's healthcare provider: Mimi Alt, MD   Chief Complaint  Patient presents with   Hypertension   Subjective       Discussed the use of AI scribe software for clinical note transcription with the patient, who gave verbal consent to proceed.  History of Present Illness Anita Miller is a 48 year old female with hypertension who presents for a follow-up visit.  Her blood pressure is well controlled with recent readings around 110/75 mmHg. She takes amlodipine  10 mg, benazepril  20 mg, and chlorthalidone  25 mg daily. No extreme blood pressure readings or symptoms like dizziness or lightheadedness have been noted.  She recalls a previous episode of low potassium (3.3 mmol/L) and a creatinine level of 1.1 mg/dL from a metabolic panel in January. Initially, she experienced sickness after taking prescribed potassium pills but now takes them by crushing into applesauce. She is concerned about the potassium's effect on her kidneys and is considering adjusting the frequency of her potassium intake.  She expressed confusion about a medication prescribed to flush sodium from her system, which she believes was chlorthalidone . After clarification, she confirmed she has been taking it. She has been taking amlodipine  and benazepril , which she refers to as Lotrel, and notes that her blood pressure remains well controlled on these medications.  She discusses her weight management, noting that she has reached her goal weight and has paused her weight loss efforts. Her BMI is now normal at 23.  She is due for a mammogram and has received a reminder for a colonoscopy, which she completed with Cologuard and is due next year.      Past Medical History:  Diagnosis Date   Epilepsy (HCC)    Hyperlipidemia    Hypertension      Medications: Outpatient Medications Prior to Visit  Medication Sig   amLODipine -benazepril  (LOTREL) 10-20 MG capsule Take 1 capsule by mouth daily.   Bacillus Coagulans-Inulin (ALIGN PREBIOTIC-PROBIOTIC) 5-1.25 MG-GM CHEW Chew by mouth daily. Currently taking Olly Probiotic   lamoTRIgine (LAMICTAL) 100 MG tablet Take 100 mg by mouth daily.   medroxyPROGESTERone  (DEPO-PROVERA ) 150 MG/ML injection Inject 1 mL (150 mg total) into the muscle every 3 (three) months.   Multiple Vitamin (MULTIVITAMIN ADULT PO) Take 1 tablet by mouth daily. Hair, skin and nails formula   ondansetron  (ZOFRAN -ODT) 4 MG disintegrating tablet Take 1 tablet (4 mg total) by mouth every 8 (eight) hours as needed for nausea or vomiting.   potassium chloride  SA (KLOR-CON  M) 20 MEQ tablet TAKE 1 TABLET(20 MEQ) BY MOUTH DAILY   [DISCONTINUED] chlorthalidone  (HYGROTON ) 25 MG tablet Take 1 tablet (25 mg total) by mouth daily.   tirzepatide 5 MG/0.5ML injection vial Inject 5 mg into the skin once a week. (Patient not taking: Reported on 05/04/2024)   No facility-administered medications prior to visit.    Review of Systems  Last metabolic panel Lab Results  Component Value Date   GLUCOSE 104 (H) 12/30/2023   NA 143 12/30/2023   K 3.3 (L) 12/30/2023   CL 101 12/30/2023   CO2 27 12/30/2023   BUN 8 12/30/2023   CREATININE 1.11 (H) 12/30/2023   EGFR 62 12/30/2023   CALCIUM 9.9 12/30/2023   PROT 6.7 12/30/2023   ALBUMIN 4.4 12/30/2023   LABGLOB 2.3 12/30/2023   AGRATIO 1.8 05/14/2023   BILITOT 0.3 12/30/2023  ALKPHOS 81 12/30/2023   AST 17 12/30/2023   ALT 10 12/30/2023   ANIONGAP 18 (H) 12/23/2023   Last hemoglobin A1c Lab Results  Component Value Date   HGBA1C 5.6 05/14/2023   Last thyroid functions Lab Results  Component Value Date   TSH 1.610 05/14/2023        Objective    BP 110/75   Pulse 94   Ht 5\' 2"  (1.575 m)   Wt 127 lb (57.6 kg)   SpO2 100%   BMI 23.23 kg/m  BP Readings from Last 3  Encounters:  05/04/24 110/75  03/25/24 112/74  02/02/24 118/82   Wt Readings from Last 3 Encounters:  05/04/24 127 lb (57.6 kg)  03/25/24 124 lb (56.2 kg)  02/02/24 137 lb 6.4 oz (62.3 kg)        Physical Exam  General: Alert, no acute distress Cardio: Normal S1 and S2, RRR, no r/m/g Pulm: CTAB, normal work of breathing ABD: soft, abdomen is not distended, normal BS  Extremities: no LE edema    No results found for any visits on 05/04/24.  Assessment & Plan     Problem List Items Addressed This Visit       Cardiovascular and Mediastinum   Essential (primary) hypertension - Primary   Chronic hypertension, well-controlled with current medication regimen. Blood pressure is 110/75 mmHg. No recent episodes of hypotension or extreme blood pressure readings. No symptoms of dizziness or lightheadedness. Chlorthalidone  discontinued due to well-controlled blood pressure and potential impact on potassium levels. - Continue amlodipine  10 mg daily - Continue benazepril  20 mg daily - Discontinue chlorthalidone  - Monitor blood pressure regularly at home      Relevant Orders   BMP8+EGFR   Other Visit Diagnoses       Encounter for screening mammogram for malignant neoplasm of breast            Assessment & Plan   Hypokalemia Previous potassium level of 3.3 mmol/L in January. Potassium supplementation caused gastrointestinal upset. Current potassium status to be reassessed with repeat basic metabolic panel. Potential discontinuation of potassium supplementation if levels are normal. Chlorthalidone  discontinued to help stabilize potassium. - Order basic metabolic panel to assess potassium level - will discontinu potassium supplementation if levels are normal, 3.5 or greater  General Health Maintenance Mammogram is due for breast cancer screening. Cologuard test completed, next colonoscopy due next year. - Order mammogram - Schedule colonoscopy for next year     Return in  about 6 months (around 11/04/2024) for CPE.         Mimi Alt, MD  [facility] [phone] (phone) [phone] (fax)  [facility]

## 2024-05-05 ENCOUNTER — Ambulatory Visit: Payer: Self-pay | Admitting: Family Medicine

## 2024-05-05 LAB — BMP8+EGFR
BUN/Creatinine Ratio: 7 — ABNORMAL LOW (ref 9–23)
BUN: 6 mg/dL (ref 6–24)
CO2: 21 mmol/L (ref 20–29)
Calcium: 9.7 mg/dL (ref 8.7–10.2)
Chloride: 106 mmol/L (ref 96–106)
Creatinine, Ser: 0.89 mg/dL (ref 0.57–1.00)
Glucose: 97 mg/dL (ref 70–99)
Potassium: 3.9 mmol/L (ref 3.5–5.2)
Sodium: 142 mmol/L (ref 134–144)
eGFR: 80 mL/min/{1.73_m2} (ref >59)

## 2024-05-07 ENCOUNTER — Other Ambulatory Visit: Payer: Self-pay | Admitting: Family Medicine

## 2024-05-07 DIAGNOSIS — E876 Hypokalemia: Secondary | ICD-10-CM

## 2024-06-10 ENCOUNTER — Ambulatory Visit

## 2024-06-10 DIAGNOSIS — Z3042 Encounter for surveillance of injectable contraceptive: Secondary | ICD-10-CM

## 2024-06-21 ENCOUNTER — Ambulatory Visit (INDEPENDENT_AMBULATORY_CARE_PROVIDER_SITE_OTHER)

## 2024-06-21 VITALS — BP 116/89 | HR 90 | Wt 129.3 lb

## 2024-06-21 DIAGNOSIS — Z3042 Encounter for surveillance of injectable contraceptive: Secondary | ICD-10-CM | POA: Diagnosis not present

## 2024-06-21 MED ORDER — MEDROXYPROGESTERONE ACETATE 150 MG/ML IM SUSP
150.0000 mg | Freq: Once | INTRAMUSCULAR | Status: AC
Start: 2024-06-21 — End: 2024-06-21
  Administered 2024-06-21: 150 mg via INTRAMUSCULAR

## 2024-06-21 NOTE — Progress Notes (Signed)
    NURSE VISIT NOTE  Subjective:    Patient ID: Anita Miller, female    DOB: 1975/12/29, 48 y.o.   MRN: [id number]  HPI  Patient is a 48 y.o. G58P2002 female who presents for depo provera  injection.   Objective:    BP 116/89   Pulse 90   Wt 129 lb 4.8 oz (58.7 kg)   BMI 23.65 kg/m   Last Annual: 02/02/2024. Last pap: 01/09/2023. Last Depo-Provera : 03/25/2024. Side Effects if any: none. Serum HCG indicated? No . Depo-Provera  150 mg IM given by: Rollo Louder, RN. Site: Left Ventrogluteal  Lab Review  No results found for any visits on 06/21/24.  Assessment:   1. Encounter for Depo-Provera  contraception      Plan:   Next appointment due between 09/06/24 and 09/20/2024.    Rollo FORBES Louder, RN

## 2024-06-21 NOTE — Patient Instructions (Signed)

## 2024-06-22 ENCOUNTER — Other Ambulatory Visit: Payer: Self-pay | Admitting: Family Medicine

## 2024-06-22 DIAGNOSIS — I10 Essential (primary) hypertension: Secondary | ICD-10-CM

## 2024-07-07 ENCOUNTER — Other Ambulatory Visit: Payer: Self-pay | Admitting: Family Medicine

## 2024-08-30 ENCOUNTER — Other Ambulatory Visit: Payer: Self-pay | Admitting: Family Medicine

## 2024-08-30 ENCOUNTER — Encounter: Payer: Self-pay | Admitting: Family Medicine

## 2024-09-08 LAB — COLOGUARD: COLOGUARD: NEGATIVE

## 2024-09-19 ENCOUNTER — Encounter: Payer: Self-pay | Admitting: Emergency Medicine

## 2024-09-19 ENCOUNTER — Ambulatory Visit
Admission: EM | Admit: 2024-09-19 | Discharge: 2024-09-19 | Disposition: A | Discharge status: Home / Self Care | Attending: Family Medicine | Admitting: Family Medicine

## 2024-09-19 DIAGNOSIS — R197 Diarrhea, unspecified: Secondary | ICD-10-CM

## 2024-09-19 DIAGNOSIS — R030 Elevated blood-pressure reading, without diagnosis of hypertension: Secondary | ICD-10-CM

## 2024-09-19 DIAGNOSIS — R1084 Generalized abdominal pain: Secondary | ICD-10-CM | POA: Diagnosis not present

## 2024-09-19 NOTE — ED Triage Notes (Signed)
 Pt presents with diarrhea, abdominal pain and headache x 2 days. Pt does feel better today, but not well enough to go to work.

## 2024-09-19 NOTE — ED Provider Notes (Addendum)
 [facility]    CSN: [id number] Arrival date & time: 09/19/24  0836      History   Chief Complaint Chief Complaint  Patient presents with   Abdominal Pain   Headache   Diarrhea    HPI Anita Miller is a 48 y.o. female.   HPI  History obtained from the patient. Anita Miller presents for headache, diarrhea and abdominal pain that started 2 days ago. Symptoms are better but are still present.  No fever, vomiting, cough, sore throat or rhinorrhea.   Needs a work note.     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   Overweight (BMI 25.0-29.9) 05/14/2023   Epilepsy (HCC) 10/15/2022   Cubital tunnel syndrome, bilateral 07/15/2022   Essential (primary) hypertension 12/04/2021   Hyperlipidemia 12/04/2021   Allergic rhinitis with postnasal drip 08/30/2021   Annual physical exam 08/30/2021   History of UTI 08/30/2021   Seizure (HCC) 12/08/2018    Past Surgical History:  Procedure Laterality Date   COSMETIC SURGERY  08/01/2020   WISDOM TOOTH EXTRACTION Bilateral 2012    OB History     Gravida  2   Para  2   Term  2   Preterm      AB      Living  2      SAB      IAB      Ectopic      Multiple      Live Births  2            Home Medications    Prior to Admission medications   Medication Sig Start Date End Date Taking? Authorizing Provider  amLODipine -benazepril  (LOTREL) 10-20 MG capsule TAKE 1 CAPSULE BY MOUTH DAILY. 07/08/24   Simmons-Robinson, Makiera, MD  Bacillus Coagulans-Inulin (ALIGN PREBIOTIC-PROBIOTIC) 5-1.25 MG-GM CHEW Chew by mouth daily. Currently taking Olly Probiotic    [provider]  lamoTRIgine (LAMICTAL) 100 MG tablet Take 100 mg by mouth daily. 03/21/21   [provider]  medroxyPROGESTERone  (DEPO-PROVERA ) 150 MG/ML injection Inject 1 mL (150 mg total)  into the muscle every 3 (three) months. 12/24/21   Connell Davies, MD  Multiple Vitamin (MULTIVITAMIN ADULT PO) Take 1 tablet by mouth daily. Hair, skin and nails formula    [provider]  ondansetron  (ZOFRAN -ODT) 4 MG disintegrating tablet DISSOLVE 1 TABLET(4 MG) ON THE TONGUE EVERY 8 HOURS AS NEEDED FOR NAUSEA OR VOMITING 08/30/24   Simmons-Robinson, Makiera, MD  potassium chloride  SA (KLOR-CON  M) 20 MEQ tablet TAKE 1 TABLET(20 MEQ) BY MOUTH DAILY 02/02/24   Simmons-Robinson, Makiera, MD  tirzepatide  5 MG/0.5ML injection vial Inject 5 mg into the skin once a week. Patient not taking: Reported on 06/21/2024 12/17/23   Simmons-Robinson, Rockie, MD    Family History Family History  Problem Relation Age of Onset   Healthy Mother    Healthy Father    Asthma Daughter    Seizures Son    Breast cancer Neg Hx     Social History Social History   Tobacco Use   Smoking status: Never   Smokeless tobacco: Never  Vaping Use   Vaping status: Never Used  Substance Use Topics   Alcohol use: Not Currently   Drug use: Yes    Types: Marijuana    Comment: occ.  Allergies   Patient has no known allergies.   Review of Systems Review of Systems: negative unless otherwise stated in HPI.      Physical Exam Triage Vital Signs ED Triage Vitals  Encounter Vitals Group     BP 09/19/24 0947 (!) 147/85     Girls Systolic BP Percentile --      Girls Diastolic BP Percentile --      Boys Systolic BP Percentile --      Boys Diastolic BP Percentile --      Pulse Rate 09/19/24 0947 93     Resp 09/19/24 0947 16     Temp 09/19/24 0947 98.1 F (36.7 C)     Temp Source 09/19/24 0947 Oral     SpO2 09/19/24 0947 100 %     Weight 09/19/24 0945 137 lb (62.1 kg)     Height --      Head Circumference --      Peak Flow --      Pain Score 09/19/24 0946 0     Pain Loc --      Pain Education --      Exclude from Growth Chart --    No data found.  Updated Vital Signs BP (!) 147/85 (BP  Location: Left Arm)   Pulse 93   Temp 98.1 F (36.7 C) (Oral)   Resp 16   Wt 62.1 kg   SpO2 100%   BMI 25.06 kg/m   Visual Acuity Right Eye Distance:   Left Eye Distance:   Bilateral Distance:    Right Eye Near:   Left Eye Near:    Bilateral Near:     Physical Exam GEN:     alert, non-toxic appearing female in no distress    HENT:  mucus membranes moist, no nasal discharge  EYES:  no scleral injection or icterus  NECK:  normal ROM   RESP:  no increased work of breathing, clear to auscultation bilaterally CVS:   regular rate and rhythm ABD:   Soft, nontender, nondistended, no guarding, no rebound, active bowel sounds throughout, negative McBurney's, negative Murphy's Skin:   warm and dry, no rash on visible skin, no jaundice     UC Treatments / Results  Labs (all labs ordered are listed, but only abnormal results are displayed) Labs Reviewed - No data to display  EKG   Radiology No results found.   Procedures Procedures (including critical care time)  Medications Ordered in UC Medications - No data to display  Initial Impression / Assessment and Plan / UC Course  I have reviewed the triage vital signs and the nursing notes.  Pertinent labs & imaging results that were available during my care of the patient were reviewed by me and considered in my medical decision making (see chart for details).       Pt is a 48 y.o. female who presents for 2 days of headache and GI symptoms. Anita Miller is afebrile here without recent antipyretics.  Her blood pressure is slightly elevated at 147/85.  Satting well on room air. Overall pt is non-toxic appearing, well hydrated, without respiratory distress. Abdominal exam is unremarkable. Suspect viral vs foodborne illness. Discussed symptomatic treatment.  Stressed the importance of hydration.  Typical duration of symptoms discussed.  Work note provided  Return and ED precautions given and voiced understanding. Discussed MDM,  treatment plan and plan for follow-up with patient who agrees with plan.     Final Clinical Impressions(s) / UC Diagnoses  Final diagnoses:  Diarrhea of presumed infectious origin  Generalized abdominal pain  Elevated blood pressure reading     Discharge Instructions      Stop by the pharmacy to pick up your prescriptions.  Follow up with your primary care provider or return to the urgent care, if not improving.   It is important to stay hydrated: drink plenty of fluids (water, gatorade/powerade/pedialyte, juices, or teas) to keep your throat moisturized and help further relieve irritation/discomfort.    Return or go to the Emergency Department if symptoms worsen or do not improve in the next few days      ED Prescriptions   None    PDMP not reviewed this encounter.      Makella Buckingham, DO 09/19/24 1406

## 2024-09-19 NOTE — Discharge Instructions (Addendum)
 Stop by the pharmacy to pick up your prescriptions.  Follow up with your primary care provider or return to the urgent care, if not improving.   It is important to stay hydrated: drink plenty of fluids (water, gatorade/powerade/pedialyte, juices, or teas) to keep your throat moisturized and help further relieve irritation/discomfort.    Return or go to the Emergency Department if symptoms worsen or do not improve in the next few days

## 2024-09-21 ENCOUNTER — Ambulatory Visit

## 2024-09-23 ENCOUNTER — Ambulatory Visit

## 2024-10-07 ENCOUNTER — Ambulatory Visit (INDEPENDENT_AMBULATORY_CARE_PROVIDER_SITE_OTHER)

## 2024-10-07 VITALS — BP 135/89 | HR 86 | Resp 16 | Ht 62.0 in | Wt 141.0 lb

## 2024-10-07 DIAGNOSIS — Z3202 Encounter for pregnancy test, result negative: Secondary | ICD-10-CM

## 2024-10-07 DIAGNOSIS — Z3042 Encounter for surveillance of injectable contraceptive: Secondary | ICD-10-CM | POA: Diagnosis not present

## 2024-10-07 LAB — POCT URINE PREGNANCY: Preg Test, Ur: NEGATIVE

## 2024-10-07 MED ORDER — MEDROXYPROGESTERONE ACETATE 150 MG/ML IM SUSP
150.0000 mg | Freq: Once | INTRAMUSCULAR | Status: AC
  Administered 2024-10-07: 150 mg via INTRAMUSCULAR

## 2024-10-07 NOTE — Progress Notes (Signed)
    NURSE VISIT NOTE  Subjective:    Patient ID: Anita Miller, female    DOB: 1976-02-17, 48 y.o.   MRN: [id number]  HPI  Patient is a 48 y.o. G33P2002 female who presents for depo provera  injection.   Objective:    Resp 16   Ht 5' 2 (1.575 m)   BMI 25.06 kg/m   Last Annual: 02/02/2024. Last pap: 01/09/2023. Last Depo-Provera : 06/21/2024. Side Effects if any: none. Serum HCG indicated? Yes . Depo-Provera  150 mg IM given by: Camelia Fetters, CMA. Site: Right Upper Outer [place]andrant  Lab Review  No results found for any visits on 10/07/24.  Assessment:   1. Encounter for surveillance of injectable contraceptive      Plan:   Next appointment due between Jan. 2 and Jan. 16    Camelia Fetters, CMA [facility]

## 2024-10-07 NOTE — Patient Instructions (Signed)

## 2024-11-04 ENCOUNTER — Encounter: Payer: Self-pay | Admitting: Family Medicine

## 2024-11-04 ENCOUNTER — Ambulatory Visit: Admitting: Family Medicine

## 2024-11-04 VITALS — BP 122/88 | HR 91 | Temp 98.6°F | Ht 62.0 in | Wt 148.2 lb

## 2024-11-04 DIAGNOSIS — N951 Menopausal and female climacteric states: Secondary | ICD-10-CM

## 2024-11-04 DIAGNOSIS — J309 Allergic rhinitis, unspecified: Secondary | ICD-10-CM

## 2024-11-04 DIAGNOSIS — Z0001 Encounter for general adult medical examination with abnormal findings: Secondary | ICD-10-CM | POA: Diagnosis not present

## 2024-11-04 DIAGNOSIS — Z0000 Encounter for general adult medical examination without abnormal findings: Secondary | ICD-10-CM

## 2024-11-04 DIAGNOSIS — Z1231 Encounter for screening mammogram for malignant neoplasm of breast: Secondary | ICD-10-CM

## 2024-11-04 DIAGNOSIS — E782 Mixed hyperlipidemia: Secondary | ICD-10-CM | POA: Diagnosis not present

## 2024-11-04 DIAGNOSIS — E663 Overweight: Secondary | ICD-10-CM

## 2024-11-04 DIAGNOSIS — I10 Essential (primary) hypertension: Secondary | ICD-10-CM | POA: Diagnosis not present

## 2024-11-04 DIAGNOSIS — R110 Nausea: Secondary | ICD-10-CM

## 2024-11-04 DIAGNOSIS — E559 Vitamin D deficiency, unspecified: Secondary | ICD-10-CM

## 2024-11-04 DIAGNOSIS — R0982 Postnasal drip: Secondary | ICD-10-CM

## 2024-11-04 DIAGNOSIS — G40909 Epilepsy, unspecified, not intractable, without status epilepticus: Secondary | ICD-10-CM | POA: Diagnosis not present

## 2024-11-04 DIAGNOSIS — L74510 Primary focal hyperhidrosis, axilla: Secondary | ICD-10-CM | POA: Diagnosis not present

## 2024-11-04 DIAGNOSIS — Z131 Encounter for screening for diabetes mellitus: Secondary | ICD-10-CM

## 2024-11-04 DIAGNOSIS — R61 Generalized hyperhidrosis: Secondary | ICD-10-CM | POA: Insufficient documentation

## 2024-11-04 MED ORDER — CLONIDINE HCL 0.1 MG PO TABS: 0.1000 mg | ORAL_TABLET | Freq: Three times a day (TID) | ORAL | 3 refills | Status: DC

## 2024-11-04 MED ORDER — CLONIDINE HCL 0.1 MG PO TABS
0.1000 mg | ORAL_TABLET | Freq: Every day | ORAL | 3 refills | Status: AC
Start: 2024-11-04 — End: ?

## 2024-11-04 NOTE — Patient Instructions (Signed)
 To keep you healthy, please keep in mind the following health maintenance items that you are due for:   Health Maintenance Due  Topic Date Due   Mammogram  01/22/2024     Best Wishes,   Dr. Lang

## 2024-11-04 NOTE — Progress Notes (Signed)
 Complete physical exam   Patient: Anita Miller   DOB: 11-Oct-1976   48 y.o. Female  MRN: [id number] Visit Date: 11/04/2024  Today's healthcare provider: Rockie Agent, MD   Chief Complaint  Patient presents with   Annual Exam    Patient is present for annual exam with PCP.  Diet is normal and well balanced. Patient is not currently exercising but has plans to restart.   Vaccines: All declined  Screenings: Mammogram- will complete    Subjective    Anita Miller is a 48 y.o. female who presents today for a complete physical exam.    She does have additional problems to discuss today.   Discussed the use of AI scribe software for clinical note transcription with the patient, who gave verbal consent to proceed.  History of Present Illness Anita Miller is a 48 year old female with hypertension and epilepsy who presents with night sweats, nausea, and phantom smells.  She experiences significant night sweats, waking up drenched, particularly in her underarms, between her thighs, and across her forehead. She has a history of hyperhidrosis affecting her forehead, underarms, and the small of her back. She has tried various treatments including pills and topicals, but discontinued them due to side effects such as dry mouth and throat discomfort.  She experiences nausea, particularly in the mornings, about three times a week. The nausea is inconsistent, with some mornings being symptom-free.  She experiences phantom smells, often detecting odors of something burning or dog feces, despite no source for these smells. These episodes are recent and occur randomly, with the smells eventually dissipating on their own. She has a history of epilepsy and has been seizure-free for six years. She continues to take Lamictal 100 mg daily.  She has been on Depo-Provera  for over a year and has not had a menstrual period during this time. She reports mood changes and trouble  sleeping, but not to the extent of requiring sleep aids.  Her current medications include amlodipine  10 mg, benazepril  20 mg daily, Lamictal 100 mg daily, Depo-Provera  every three months, and a probiotic combination. She has declined COVID, flu, and hepatitis B vaccines. She has a history of vitamin D  deficiency, with a previous level of 17, and is concerned about its potential impact on her symptoms. No issues with coughing, shortness of breath, or chest tightness. No recent seizures. No heartburn. No periods for over a year since starting Depo-Provera . Blood pressure at home typically ranges from 128-133/68-80.     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)   Social Drivers of Health   Financial Resource Strain: Low Risk  (05/04/2024)   Overall Financial Resource Strain (CARDIA)    Difficulty of Paying Living Expenses: Not hard at all  Food Insecurity: No Food Insecurity (05/04/2024)   Hunger Vital Sign    Worried About Running Out of Food in the Last Year: Never true    Ran Out of Food in the Last Year: Never true  Transportation  Needs: No Transportation Needs (05/04/2024)   PRAPARE - Administrator, Civil Service (Medical): No    Lack of Transportation (Non-Medical): No  Physical Activity: Unknown (12/08/2018)   Exercise Vital Sign    Days of Exercise per Week: Patient declined    Minutes of Exercise per Session: Patient declined  Stress: No Stress Concern Present (05/04/2024)   [employer] of Occupational Health - Occupational Stress Questionnaire    Feeling of Stress : Not at all  Social Connections: Unknown (12/08/2018)   Social Connection and Isolation Panel    Frequency of Communication with Friends and Family: Patient declined    Frequency of Social Gatherings with Friends and Family: Patient declined    Attends Religious Services: Patient declined    Database Administrator or Organizations: Patient declined    Attends Banker Meetings: Patient declined    Marital Status: Patient declined  Intimate Partner Violence: Not At Risk (05/04/2024)   Humiliation, Afraid, Rape, and Kick questionnaire    Fear of Current or Ex-Partner: No    Emotionally Abused: No    Physically Abused: No    Sexually Abused: No   Family Status  Relation Name Status   Mother  Alive   Father  Alive   Daughter  Alive   Son  Alive   Neg Hx  (Not Specified)  No partnership data on file   Family History  Problem Relation Age of Onset   Healthy Mother    Healthy Father    Asthma Daughter    Seizures Son    Breast cancer Neg Hx    No Known Allergies   Medications: Outpatient Medications Prior to Visit  Medication Sig   amLODipine -benazepril  (LOTREL) 10-20 MG capsule TAKE 1 CAPSULE BY MOUTH DAILY.   Bacillus Coagulans-Inulin (ALIGN PREBIOTIC-PROBIOTIC) 5-1.25 MG-GM CHEW Chew by mouth daily. Currently taking Olly Probiotic   lamoTRIgine (LAMICTAL) 100 MG tablet Take 100 mg by mouth daily.   medroxyPROGESTERone  (DEPO-PROVERA ) 150 MG/ML injection Inject 1 mL (150 mg total) into the muscle every 3  (three) months.   Multiple Vitamin (MULTIVITAMIN ADULT PO) Take 1 tablet by mouth daily. Hair, skin and nails formula   ondansetron  (ZOFRAN -ODT) 4 MG disintegrating tablet DISSOLVE 1 TABLET(4 MG) ON THE TONGUE EVERY 8 HOURS AS NEEDED FOR NAUSEA OR VOMITING   No facility-administered medications prior to visit.    Review of Systems  Last CBC Lab Results  Component Value Date   WBC 11.1 (H) 12/23/2023   HGB 16.6 (H) 12/23/2023   HCT 46.6 (H) 12/23/2023   MCV 84.9 12/23/2023   MCH 30.2 12/23/2023   RDW 12.2 12/23/2023   PLT 511 (H) 12/23/2023   Last metabolic panel Lab Results  Component Value Date   GLUCOSE 97 05/04/2024   NA 142 05/04/2024   K 3.9 05/04/2024   CL 106 05/04/2024   CO2 21 05/04/2024   BUN 6 05/04/2024   CREATININE 0.89 05/04/2024  EGFR 80 05/04/2024   CALCIUM 9.7 05/04/2024   PROT 6.7 12/30/2023   ALBUMIN 4.4 12/30/2023   LABGLOB 2.3 12/30/2023   AGRATIO 1.8 05/14/2023   BILITOT 0.3 12/30/2023   ALKPHOS 81 12/30/2023   AST 17 12/30/2023   ALT 10 12/30/2023   ANIONGAP 18 (H) 12/23/2023   Last lipids Lab Results  Component Value Date   CHOL 216 (H) 05/14/2023   HDL 51 05/14/2023   LDLCALC 148 (H) 05/14/2023   TRIG 93 05/14/2023   CHOLHDL 4.2 05/14/2023   Last hemoglobin A1c Lab Results  Component Value Date   HGBA1C 5.6 05/14/2023   Last thyroid functions Lab Results  Component Value Date   TSH 1.610 05/14/2023   FREET4 1.31 05/14/2023   Last vitamin D  Lab Results  Component Value Date   VD25OH 17.5 (L) 10/15/2022   Last vitamin B12 and Folate No results found for: VITAMINB12, FOLATE     Objective    BP (!) 133/98 (BP Location: Left Arm, Patient Position: Sitting, Cuff Size: Normal)   Pulse 91   Temp 98.6 F (37 C) (Oral)   Ht 5' 2 (1.575 m)   Wt 148 lb 3.2 oz (67.2 kg)   SpO2 100%   BMI 27.11 kg/m   BP Readings from Last 3 Encounters:  11/04/24 (!) 133/98  10/07/24 135/89  09/19/24 (!) 147/85   Wt Readings from  Last 3 Encounters:  11/04/24 148 lb 3.2 oz (67.2 kg)  10/07/24 141 lb (64 kg)  09/19/24 137 lb (62.1 kg)        Physical Exam Vitals reviewed.  Constitutional:      General: She is not in acute distress.    Appearance: Normal appearance. She is not ill-appearing, toxic-appearing or diaphoretic.  HENT:     Head: Normocephalic and atraumatic.     Right Ear: Tympanic membrane and external ear normal. There is no impacted cerumen.     Left Ear: Tympanic membrane and external ear normal. There is no impacted cerumen.     Nose: Nose normal.     Mouth/Throat:     Pharynx: Oropharynx is clear.  Eyes:     General: No scleral icterus.    Extraocular Movements: Extraocular movements intact.     Conjunctiva/sclera: Conjunctivae normal.     Pupils: Pupils are equal, round, and reactive to light.  Cardiovascular:     Rate and Rhythm: Normal rate and regular rhythm.     Pulses: Normal pulses.     Heart sounds: Normal heart sounds. No murmur heard.    No friction rub. No gallop.  Pulmonary:     Effort: Pulmonary effort is normal. No respiratory distress.     Breath sounds: Normal breath sounds. No wheezing, rhonchi or rales.  Abdominal:     General: Bowel sounds are normal. There is no distension.     Palpations: Abdomen is soft. There is no mass.     Tenderness: There is no abdominal tenderness. There is no guarding.  Musculoskeletal:        General: No deformity.     Cervical back: Normal range of motion and neck supple.     Right lower leg: No edema.     Left lower leg: No edema.  Lymphadenopathy:     Cervical: No cervical adenopathy.  Skin:    General: Skin is warm.     Capillary Refill: Capillary refill takes less than 2 seconds.     Findings: No erythema or rash.  Neurological:  General: No focal deficit present.     Mental Status: She is alert and oriented to person, place, and time.     Cranial Nerves: Cranial nerves 2-12 are intact. No cranial nerve deficit or facial  asymmetry.     Motor: Motor function is intact. No weakness.     Gait: Gait normal.  Psychiatric:        Mood and Affect: Mood normal.        Behavior: Behavior normal.       Last depression screening scores    11/04/2024    9:23 AM 05/04/2024    8:53 AM 02/02/2024    9:22 AM  PHQ 2/9 Scores  PHQ - 2 Score 0 0 0  PHQ- 9 Score 0 0       Data saved with a previous flowsheet row definition    Last fall risk screening    11/04/2024    9:23 AM  Fall Risk   Falls in the past year? 0  Number falls in past yr: 0  Injury with Fall? 0  Risk for fall due to : No Fall Risks  Follow up Falls evaluation completed    Last Audit-C alcohol use screening    05/04/2024    8:52 AM  Alcohol Use Disorder Test (AUDIT)  1. How often do you have a drink containing alcohol? 1  2. How many drinks containing alcohol do you have on a typical day when you are drinking? 0  3. How often do you have six or more drinks on one occasion? 0  AUDIT-C Score 1   A score of 3 or more in women, and 4 or more in men indicates increased risk for alcohol abuse, EXCEPT if all of the points are from question 1   No results found for any visits on 11/04/24.  Assessment & Plan    Routine Health Maintenance and Physical Exam  Immunization History  Administered Date(s) Administered   MMR 07/01/1995   Td 07/01/1995   Tdap 08/30/2021    Health Maintenance  Topic Date Due   Mammogram  01/22/2024   COVID-19 Vaccine (1 - 2025-26 season) 11/20/2024 (Originally 08/22/2024)   Influenza Vaccine  03/21/2025 (Originally 07/22/2024)   Hepatitis B Vaccines 19-59 Average Risk (1 of 3 - 19+ 3-dose series) 11/04/2025 (Originally 01/18/1996)   Fecal DNA (Cologuard)  09/04/2027   Cervical Cancer Screening (HPV/Pap Cotest)  01/10/2028   DTaP/Tdap/Td (3 - Td or Tdap) 08/31/2031   Hepatitis C Screening  Completed   HIV Screening  Completed   Pneumococcal Vaccine  Aged Out   HPV VACCINES  Aged Out   Meningococcal B Vaccine   Aged Out    Problem List Items Addressed This Visit     Allergic rhinitis with postnasal drip   Annual physical exam - Primary   Epilepsy (HCC)   Essential (primary) hypertension   Relevant Medications   cloNIDine (CATAPRES) 0.1 MG tablet   Other Relevant Orders   Comprehensive metabolic panel with GFR   Hyperhidrosis of axilla   Relevant Orders   TSH + free T4   Hyperlipidemia   Relevant Medications   cloNIDine (CATAPRES) 0.1 MG tablet   Other Relevant Orders   Lipid panel   Nausea   Relevant Orders   HgB A1c   Night sweats   Relevant Orders   TSH + free T4   Overweight (BMI 25.0-29.9)   Relevant Orders   Comprehensive metabolic panel with GFR   Vitamin D  deficiency  Relevant Orders   VITAMIN D  25 Hydroxy (Vit-D Deficiency, Fractures)   Other Visit Diagnoses       Screening for diabetes mellitus       Relevant Orders   HgB A1c     Breast cancer screening by mammogram       Relevant Orders   MM 3D SCREENING MAMMOGRAM BILATERAL BREAST     Vasomotor symptoms due to menopause       Relevant Medications   cloNIDine (CATAPRES) 0.1 MG tablet       Assessment and Plan Assessment & Plan Adult Wellness Visit Routine adult wellness visit with discussion on general health maintenance, including screenings and vaccinations. - Ordered diabetes screening - Ordered mammogram for breast cancer screening - Ordered CMP and lipid panel - Discussed next Pap smear due in 2029 - Discussed Cologuard due in 2028 - Discussed tetanus vaccine up to date, next due in 2032  Essential hypertension Chronic Blood pressure recorded at 133/98 mmHg. Home readings typically range from 128-133/60-80 mmHg. Clonidine may aid in blood pressure control and vasomotor symptoms. - Rechecked blood pressure - Continue amlodipine  10mg  daily and benazepril  20mg  daily  - Started clonidine 0.1 mg at bedtime  Epilepsy, unspecified, not intractable, without status epilepticus Seizure-free for six  years. Recent onset of phantosmia and nausea raises concern for possible neurological issues. - Discuss phantosmia and nausea with neurologist - Consider updated head imaging if recommended by neurologist  Menopausal state with vasomotor symptoms (hot flashes, night sweats) Chronic symptoms  Symptoms include night sweats and hot flashes. No periods for over a year, likely menopause. Clonidine may help with vasomotor symptoms. - Started clonidine 0.1 mg at bedtime for vasomotor symptoms  Primary focal and generalized hyperhidrosis Hyperhidrosis affecting forehead, underarms, and small of back. Previous treatments included pills and topicals, but discontinued due to side effects. No Botox or injections attempted. - Referred to dermatology for hyperhidrosis management, pt has appt scheduled   Mixed hyperlipidemia Plan to check cholesterol levels as part of routine labs. - Ordered lipid panel  Overweight BMI is 27.11, indicating overweight status. Previous weight loss with Zepbound , but weight regained after discontinuation. Plans to restart at a lower dosage after resolving current symptoms. - Discussed weight management strategies  Vitamin D  deficiency Chronic, no current supplement  Previous vitamin D  level was 17 ng/mL. Plan to recheck vitamin D  levels as part of routine labs. - Ordered vitamin D  level  Nausea and phantosmia (intermittent nausea and phantom smells) Intermittent nausea and phantom smells of burning and feces. No associated swallowing difficulties or abdominal pain. Differential includes neurological causes or intracranial pressure issues. Neurology consultation pending. - Discuss nausea and phantosmia with neurologist - Will consider EGD with GI if symptoms persist       Return in about 3 months (around 02/04/2025) for HTN,nausea.       Rockie Agent, MD  [facility] [phone] (phone) [phone] (fax)  [facility]

## 2024-11-05 ENCOUNTER — Encounter: Payer: Self-pay | Admitting: Family Medicine

## 2024-11-05 LAB — COMPREHENSIVE METABOLIC PANEL WITH GFR
ALT: 14 IU/L (ref 0–32)
AST: 18 IU/L (ref 0–40)
Albumin: 4.6 g/dL (ref 3.9–4.9)
Alkaline Phosphatase: 87 IU/L (ref 41–116)
BUN/Creatinine Ratio: 6 — ABNORMAL LOW (ref 9–23)
BUN: 5 mg/dL — ABNORMAL LOW (ref 6–24)
Bilirubin Total: 0.3 mg/dL (ref 0.0–1.2)
CO2: 24 mmol/L (ref 20–29)
Calcium: 9.7 mg/dL (ref 8.7–10.2)
Chloride: 100 mmol/L (ref 96–106)
Creatinine, Ser: 0.88 mg/dL (ref 0.57–1.00)
Globulin, Total: 2.6 g/dL (ref 1.5–4.5)
Glucose: 87 mg/dL (ref 70–99)
Potassium: 4 mmol/L (ref 3.5–5.2)
Sodium: 139 mmol/L (ref 134–144)
Total Protein: 7.2 g/dL (ref 6.0–8.5)
eGFR: 82 mL/min/1.73 (ref >59)

## 2024-11-05 LAB — TSH+FREE T4
Free T4: 1 ng/dL (ref 0.82–1.77)
TSH: 3.16 u[IU]/mL (ref 0.450–4.500)

## 2024-11-05 LAB — LIPID PANEL
Chol/HDL Ratio: 3.7 ratio (ref 0.0–4.4)
Cholesterol, Total: 215 mg/dL — ABNORMAL HIGH (ref 100–199)
HDL: 58 mg/dL (ref >39)
LDL Chol Calc (NIH): 138 mg/dL — ABNORMAL HIGH (ref 0–99)
Triglycerides: 106 mg/dL (ref 0–149)
VLDL Cholesterol Cal: 19 mg/dL (ref 5–40)

## 2024-11-05 LAB — HEMOGLOBIN A1C
Est. average glucose Bld gHb Est-mCnc: 108 mg/dL
Hgb A1c MFr Bld: 5.4 % (ref 4.8–5.6)

## 2024-11-05 LAB — VITAMIN D 25 HYDROXY (VIT D DEFICIENCY, FRACTURES): Vit D, 25-Hydroxy: 12.3 ng/mL — ABNORMAL LOW (ref 30.0–100.0)

## 2024-11-10 ENCOUNTER — Ambulatory Visit: Payer: Self-pay | Admitting: Family Medicine

## 2024-11-10 DIAGNOSIS — E559 Vitamin D deficiency, unspecified: Secondary | ICD-10-CM

## 2024-11-10 MED ORDER — VITAMIN D (ERGOCALCIFEROL) 1.25 MG (50000 UNIT) PO CAPS: 50000.0000 [IU] | ORAL_CAPSULE | ORAL | 1 refills | Status: AC

## 2024-12-30 ENCOUNTER — Ambulatory Visit

## 2024-12-30 VITALS — BP 104/80 | HR 88 | Resp 16 | Ht 62.0 in | Wt 139.2 lb

## 2024-12-30 DIAGNOSIS — Z3042 Encounter for surveillance of injectable contraceptive: Secondary | ICD-10-CM | POA: Diagnosis not present

## 2024-12-30 MED ORDER — MEDROXYPROGESTERONE ACETATE 150 MG/ML IM SUSP
150.0000 mg | Freq: Once | INTRAMUSCULAR | Status: AC
  Administered 2024-12-30: 150 mg via INTRAMUSCULAR

## 2024-12-30 NOTE — Patient Instructions (Signed)

## 2024-12-30 NOTE — Progress Notes (Signed)
" ° ° °  NURSE VISIT NOTE  Subjective:    Patient ID: Anita Miller, female    DOB: [birth year], 49 y.o.   MRN: [id number]  HPI  Patient is a 49 y.o. G59P2002 female who presents for depo provera  injection.   Objective:    BP 104/80   Pulse 88   Resp 16   Ht 5' 2 (1.575 m)   Wt 139 lb 3.2 oz (63.1 kg)   BMI 25.46 kg/m   Last Annual: 02/02/2024. Last pap: 01/09/2023. Last Depo-Provera : 10/07/2024. Side Effects if any: none. Serum HCG indicated? No . Depo-Provera  150 mg IM given by: Camelia Fetters, CMA. Site: Left Deltoid  Lab Review  No results found for any visits on 12/30/24.  Assessment:   1. Encounter for surveillance of injectable contraceptive      Plan:   Next appointment due between March 27 and April 10.    Camelia Fetters, CMA [facility]

## 2025-02-07 ENCOUNTER — Ambulatory Visit: Admitting: Family Medicine
